# Patient Record
Sex: Female | Born: 1970 | Race: Black or African American | Hispanic: No | Marital: Single | Smoking: Never smoker
Health system: Southern US, Community
[De-identification: ages and names within clinical notes are randomized; demographics above are authoritative.]

## PROBLEM LIST (undated history)

## (undated) DIAGNOSIS — IMO0002 Reserved for concepts with insufficient information to code with codable children: Secondary | ICD-10-CM

## (undated) DIAGNOSIS — Z8669 Personal history of other diseases of the nervous system and sense organs: Secondary | ICD-10-CM

## (undated) DIAGNOSIS — R102 Pelvic and perineal pain: Secondary | ICD-10-CM

## (undated) DIAGNOSIS — N809 Endometriosis, unspecified: Secondary | ICD-10-CM

## (undated) DIAGNOSIS — K5900 Constipation, unspecified: Secondary | ICD-10-CM

## (undated) DIAGNOSIS — B373 Candidiasis of vulva and vagina: Secondary | ICD-10-CM

## (undated) DIAGNOSIS — B9689 Other specified bacterial agents as the cause of diseases classified elsewhere: Secondary | ICD-10-CM

## (undated) DIAGNOSIS — Z8639 Personal history of other endocrine, nutritional and metabolic disease: Secondary | ICD-10-CM

## (undated) DIAGNOSIS — D219 Benign neoplasm of connective and other soft tissue, unspecified: Secondary | ICD-10-CM

## (undated) DIAGNOSIS — Z8742 Personal history of other diseases of the female genital tract: Secondary | ICD-10-CM

## (undated) DIAGNOSIS — N760 Acute vaginitis: Secondary | ICD-10-CM

## (undated) HISTORY — DX: Personal history of other diseases of the nervous system and sense organs: Z86.69

## (undated) HISTORY — DX: Other specified bacterial agents as the cause of diseases classified elsewhere: B96.89

## (undated) HISTORY — DX: Endometriosis, unspecified: N80.9

## (undated) HISTORY — DX: Acute vaginitis: N76.0

## (undated) HISTORY — DX: Personal history of other diseases of the female genital tract: Z87.42

## (undated) HISTORY — DX: Reserved for concepts with insufficient information to code with codable children: IMO0002

## (undated) HISTORY — DX: Candidiasis of vulva and vagina: B37.3

## (undated) HISTORY — DX: Constipation, unspecified: K59.00

## (undated) HISTORY — DX: Benign neoplasm of connective and other soft tissue, unspecified: D21.9

## (undated) HISTORY — DX: Personal history of other endocrine, nutritional and metabolic disease: Z86.39

## (undated) HISTORY — DX: Pelvic and perineal pain: R10.2

---

## 1998-10-07 HISTORY — PX: REDUCTION MAMMAPLASTY: SUR839

## 2002-05-07 DIAGNOSIS — IMO0002 Reserved for concepts with insufficient information to code with codable children: Secondary | ICD-10-CM

## 2002-05-07 DIAGNOSIS — R87619 Unspecified abnormal cytological findings in specimens from cervix uteri: Secondary | ICD-10-CM

## 2002-05-07 HISTORY — DX: Unspecified abnormal cytological findings in specimens from cervix uteri: R87.619

## 2002-05-07 HISTORY — DX: Reserved for concepts with insufficient information to code with codable children: IMO0002

## 2003-11-02 ENCOUNTER — Other Ambulatory Visit: Admission: RE | Admit: 2003-11-02 | Discharge: 2003-11-02 | Payer: Self-pay | Admitting: Obstetrics and Gynecology

## 2004-06-07 DIAGNOSIS — B9689 Other specified bacterial agents as the cause of diseases classified elsewhere: Secondary | ICD-10-CM

## 2004-06-07 HISTORY — DX: Other specified bacterial agents as the cause of diseases classified elsewhere: B96.89

## 2004-06-26 DIAGNOSIS — R102 Pelvic and perineal pain: Secondary | ICD-10-CM

## 2004-06-26 HISTORY — DX: Pelvic and perineal pain: R10.2

## 2004-08-07 DIAGNOSIS — B3731 Acute candidiasis of vulva and vagina: Secondary | ICD-10-CM

## 2004-08-07 DIAGNOSIS — B373 Candidiasis of vulva and vagina: Secondary | ICD-10-CM

## 2004-08-07 HISTORY — DX: Candidiasis of vulva and vagina: B37.3

## 2004-08-07 HISTORY — DX: Acute candidiasis of vulva and vagina: B37.31

## 2004-09-25 ENCOUNTER — Ambulatory Visit: Payer: Self-pay | Admitting: Family Medicine

## 2004-10-07 DIAGNOSIS — K5900 Constipation, unspecified: Secondary | ICD-10-CM

## 2004-10-07 HISTORY — DX: Constipation, unspecified: K59.00

## 2004-11-02 ENCOUNTER — Other Ambulatory Visit: Admission: RE | Admit: 2004-11-02 | Discharge: 2004-11-02 | Payer: Self-pay | Admitting: Obstetrics and Gynecology

## 2006-06-17 ENCOUNTER — Other Ambulatory Visit: Admission: RE | Admit: 2006-06-17 | Discharge: 2006-06-17 | Payer: Self-pay | Admitting: Obstetrics and Gynecology

## 2009-10-07 DIAGNOSIS — Z8742 Personal history of other diseases of the female genital tract: Secondary | ICD-10-CM

## 2009-10-07 HISTORY — DX: Personal history of other diseases of the female genital tract: Z87.42

## 2009-12-11 DIAGNOSIS — D219 Benign neoplasm of connective and other soft tissue, unspecified: Secondary | ICD-10-CM

## 2009-12-11 HISTORY — DX: Benign neoplasm of connective and other soft tissue, unspecified: D21.9

## 2010-10-27 ENCOUNTER — Encounter: Payer: Self-pay | Admitting: Family Medicine

## 2010-10-28 ENCOUNTER — Encounter: Payer: Self-pay | Admitting: Obstetrics and Gynecology

## 2012-02-14 ENCOUNTER — Telehealth: Payer: Self-pay | Admitting: Obstetrics and Gynecology

## 2012-02-14 NOTE — Telephone Encounter (Signed)
Spoke with pt rgd msg pt c/o abdominal pelvic pain offered appt for eval on manday with AR pt declined appt will call back

## 2012-02-18 ENCOUNTER — Encounter: Payer: Self-pay | Admitting: Obstetrics and Gynecology

## 2012-03-09 ENCOUNTER — Encounter: Payer: Self-pay | Admitting: Obstetrics and Gynecology

## 2012-03-10 ENCOUNTER — Encounter: Payer: BC Managed Care – HMO | Admitting: Obstetrics and Gynecology

## 2012-03-10 ENCOUNTER — Telehealth: Payer: Self-pay | Admitting: Obstetrics and Gynecology

## 2012-03-10 ENCOUNTER — Encounter: Payer: Self-pay | Admitting: Obstetrics and Gynecology

## 2012-03-10 ENCOUNTER — Ambulatory Visit (INDEPENDENT_AMBULATORY_CARE_PROVIDER_SITE_OTHER): Payer: BC Managed Care – PPO | Admitting: Obstetrics and Gynecology

## 2012-03-10 VITALS — BP 118/78 | Wt 183.0 lb

## 2012-03-10 DIAGNOSIS — R102 Pelvic and perineal pain: Secondary | ICD-10-CM

## 2012-03-10 DIAGNOSIS — N946 Dysmenorrhea, unspecified: Secondary | ICD-10-CM | POA: Insufficient documentation

## 2012-03-10 DIAGNOSIS — N949 Unspecified condition associated with female genital organs and menstrual cycle: Secondary | ICD-10-CM

## 2012-03-10 DIAGNOSIS — D219 Benign neoplasm of connective and other soft tissue, unspecified: Secondary | ICD-10-CM

## 2012-03-10 DIAGNOSIS — D259 Leiomyoma of uterus, unspecified: Secondary | ICD-10-CM

## 2012-03-10 LAB — POCT URINALYSIS DIPSTICK
Clarity, UA: NEGATIVE
Protein, UA: NEGATIVE
Urobilinogen, UA: NEGATIVE

## 2012-03-10 LAB — POCT WET PREP (WET MOUNT)
Clue Cells Wet Prep Whiff POC: NEGATIVE
pH: 5.5

## 2012-03-10 MED ORDER — TRAMADOL HCL 50 MG PO TABS: 50.0000 mg | ORAL_TABLET | Freq: Four times a day (QID) | ORAL | Status: DC | PRN

## 2012-03-10 NOTE — Telephone Encounter (Signed)
TC to pt.   States her appt has been R/S'd x2. Desires eval today.   Sched w/ Dr AVS.

## 2012-03-10 NOTE — Telephone Encounter (Signed)
Triage/resched

## 2012-03-10 NOTE — Progress Notes (Signed)
Contraception: none History of STD:  no history of PID, STD's History of ovarian cyst: No History of fibroids: yes:   History of endometriosis:yes:   Previous ultrasound:yes:  2009  Urinary symptoms: none Gastro-intestinal symptoms:  Constipation: no     Diarrhea: no     Nausea: no     Vomiting: no     Fever: no Vaginal discharge: normal discharge   BT  Tammy Jenkins is a 41 y.o. year old female,No obstetric history on file., who presents for a problem visit. The patient has a known history of fibroids.  She has a past history of pelvic pain.  Subjective:  The patient reports that for the past 3 months she has had worsening dysmenorrhea.  She rates her pain as 10 out of 10.  She denies GU and GI symptoms.  She denies fever and chills.  Objective:  BP 118/78  Wt 183 lb (83.008 kg)  LMP 03/01/2012   General: alert, cooperative and no distress Resp: clear to auscultation bilaterally Cardio: regular rate and rhythm, S1, S2 normal, no murmur, click, rub or gallop GI: soft, non-tender; bowel sounds normal; no masses,  no organomegaly  External genitalia: normal general appearance Vaginal: normal mucosa without prolapse or lesions Cervix: normal appearance Adnexa: normal bimanual exam Uterus: upper limits normal size, irregular.  Wet prep: Negative  Assessment:  Fibroid uterus Dysmenorrhea  Plan:  Ultrasound of the pelvis GC and Chlamydia sent Ultram 50 mg every 6 hours when necessary pain  Return to office in 1 week(s).   Leonard Schwartz M.D.  03/10/2012 7:07 PM

## 2012-03-11 LAB — GC/CHLAMYDIA PROBE AMP, GENITAL: Chlamydia, DNA Probe: NEGATIVE

## 2012-04-22 ENCOUNTER — Encounter: Payer: BC Managed Care – PPO | Admitting: Obstetrics and Gynecology

## 2012-04-22 ENCOUNTER — Other Ambulatory Visit: Payer: BC Managed Care – PPO

## 2012-04-29 ENCOUNTER — Other Ambulatory Visit: Payer: BC Managed Care – PPO

## 2012-04-30 ENCOUNTER — Encounter: Payer: BC Managed Care – PPO | Admitting: Obstetrics and Gynecology

## 2012-04-30 ENCOUNTER — Other Ambulatory Visit: Payer: BC Managed Care – PPO

## 2012-05-11 ENCOUNTER — Ambulatory Visit (INDEPENDENT_AMBULATORY_CARE_PROVIDER_SITE_OTHER): Payer: BC Managed Care – PPO | Admitting: Obstetrics and Gynecology

## 2012-05-11 ENCOUNTER — Ambulatory Visit (INDEPENDENT_AMBULATORY_CARE_PROVIDER_SITE_OTHER): Payer: BC Managed Care – PPO

## 2012-05-11 ENCOUNTER — Other Ambulatory Visit: Payer: Self-pay | Admitting: Obstetrics and Gynecology

## 2012-05-11 ENCOUNTER — Encounter: Payer: Self-pay | Admitting: Obstetrics and Gynecology

## 2012-05-11 VITALS — BP 112/78 | Wt 188.0 lb

## 2012-05-11 DIAGNOSIS — D219 Benign neoplasm of connective and other soft tissue, unspecified: Secondary | ICD-10-CM

## 2012-05-11 DIAGNOSIS — N946 Dysmenorrhea, unspecified: Secondary | ICD-10-CM

## 2012-05-11 DIAGNOSIS — D259 Leiomyoma of uterus, unspecified: Secondary | ICD-10-CM

## 2012-05-11 DIAGNOSIS — N920 Excessive and frequent menstruation with regular cycle: Secondary | ICD-10-CM

## 2012-05-11 NOTE — Patient Instructions (Signed)
Myomectomy Myoma is a non-cancerous tumor made up of fibrous tissue. It is also called leiomyoma, but more often called a fibroid tumor. Myomectomy is the removal of a fibroid tumor without removing another organ, like the uterus or ovary, with it. Fibroids range from the size of a pea to a grapefruit. They are rarely cancerous. Myomas only need treatment when they are growing or when they cause symptoms, such aspain, pressure, bleeding, and pain with intercourse. LET YOUR CAREGIVER KNOW ABOUT:  Any allergies, especially to medicines.   If you develop a cold or an infection before your surgery.   Medicines taken, including vitamins, herbs, eyedrops, over-ther-counter medicines, and creams.   Use of steroids (by mouth or creams).   Previous problems with numbing medicines.   History of blood clots or other bleeding problems.   Other health problems, such as diabetes, kidney, heart, or lung problems.   Previous surgery.   Possibility of pregnancy, if this applies.  RISKS AND COMPLICATIONS   Excessive bleeding.   Infection.   Injury to other organs.   Blood clots in the legs, chest, and brain.   Scar tissue (adhesions) on other organs and in the pelvis.   Death during or after the surgery.  BEFORE THE PROCEDURE  Follow your caregiver's advice regarding your surgery and preparing for surgery.   Avoid taking aspirin or blood thinners as directed by your caregiver.   DO NOT eat or drink anything after midnight on the night before surgery, or as directed by your caregiver.   DO NOT smoke (if you smoke) for 2 weeks before the surgery.   DO NOT drink alcohol the day before the surgery.   If you are admitted the day of the surgery,arrive1 hour before your surgery is scheduled.   Arrange to have someone take you home from the hospital.   Arrange to have someone care for you when you go home.  PROCEDURE There are several ways to perform a myomectomy:  Hysteroscopy  myomectomy. A lighted tube is inserted inside the uterus. The tube will remove the fibroid. This is used when the fibroid is inside the cavity of the uterus.   Laparoscopic myomectomy. A long, lighted tube is inserted through 2 or 3 small incisions to see the organs in the pelvis. The fibroid is removed.   Myomectomy through a sugical cut (incicion) in the abdomen. The fibroid is removed through an incision made in the stomach. This way is performed when thethe fibroid cannot be removed with a hysteroscope or laprascope.  AFTER THE PROCEDURE  If you had laparoscopic or hysteroscopic myomectomy, you may go home the same day or stay overnight.   If you had abdominal myomectomy, you may stay in the hospital a few days.   Your intravenous (IV)access tube and catheter will be removed in 1 or 2 days.   If you stay in the hospital, your caregiver will order pain medicine and a sleeping pill, if needed.   You may be placed on an antibiotic medicine, if needed.   You may be given written instructions and medicines before you are sent home.  Document Released: 07/21/2007 Document Revised: 09/12/2011 Document Reviewed: 08/02/2009 [employer] Patient Information 2012 [employer]. 

## 2012-05-11 NOTE — Progress Notes (Signed)
Pt is here today for a follow up U/S for fibroids and dysmenorrhea.  HISTORY OF PRESENT ILLNESS  Ms. Tammy Jenkins is a 41 y.o. year old female,No obstetric history on file., who presents for a problem visit. The patient has a known history of fibroids, dysmenorrhea, and menorrhagia.  Ultrasound does help.  Subjective:  The patient wants to have children.  Objective:  BP 112/78  Wt 188 lb (85.276 kg)  LMP 04/29/2012   General: alert and no distress GI: soft, non-tender; bowel sounds normal; no masses,  no organomegaly  Exam deferred.  Ultrasound: Uterus 10.86 x 8.47 cm.  Endometrium 5.83 mm.  Ovaries within normal limits.  A minimum of 7 fibroids are noted with the largest fibroid measuring 5. To 8 cm.  Fibroids are thought to be subserosal and intramural.  Assessment:  Fibroids Menorrhagia Dysmenorrhea Wants to have children  Plan:  The management of fibroids was reviewed once again.  Risk and benefits were discussed.  The patient will review her options with her husband and then call us back to discuss what she would like to do.  Return to office prn if symptoms worsen or fail to improve.   Leonard Schwartz M.D.  05/11/2012 7:21 PM

## 2012-06-02 ENCOUNTER — Telehealth: Payer: Self-pay | Admitting: Obstetrics and Gynecology

## 2012-06-02 ENCOUNTER — Other Ambulatory Visit: Payer: Self-pay | Admitting: Obstetrics and Gynecology

## 2012-06-02 DIAGNOSIS — N946 Dysmenorrhea, unspecified: Secondary | ICD-10-CM

## 2012-06-02 MED ORDER — TRAMADOL HCL 50 MG PO TABS: 50.0000 mg | ORAL_TABLET | Freq: Four times a day (QID) | ORAL | Status: DC | PRN

## 2012-06-02 NOTE — Telephone Encounter (Signed)
TC to pt. States was informed that has multiple fibroids.  Having more left-sided pain.   Questioning if could have procedure to alleviate that particular are and not all of fibroids. Also requests RF for Ultram. States takes 2/day. DR AVS to be made aware.

## 2012-06-02 NOTE — Telephone Encounter (Signed)
TC to pt. LM to return call.  

## 2012-06-03 ENCOUNTER — Telehealth: Payer: Self-pay | Admitting: Obstetrics and Gynecology

## 2012-06-03 NOTE — Telephone Encounter (Signed)
TC to pt. Informed per DR AVS that it is possible to have U/S ablation on one fibroid, but there is no guarantee that it will relieve all pain. Pt verbalizes comprehension.Pt states there is a study about fibroids being done at [facility], and she has contacted them for information. Advised needs appt if additional pain med needed.

## 2012-07-17 ENCOUNTER — Telehealth: Payer: Self-pay | Admitting: Obstetrics and Gynecology

## 2012-07-17 NOTE — Telephone Encounter (Signed)
Tc to pt regarding msg, lm on vm to call back. 

## 2012-07-21 ENCOUNTER — Telehealth: Payer: Self-pay | Admitting: Obstetrics and Gynecology

## 2012-07-21 NOTE — Telephone Encounter (Signed)
Returned pt's call. LM to return call.   

## 2012-07-22 NOTE — Telephone Encounter (Signed)
TC from pt.  States is having pain daily and is taking Aleve. Has appt at [facility] to discuss options 08/17/12.  Questioning if there is any surgery that could be done now to provide relief of pain due to fibroids, without having to do ablation or hysterectomy. Pt questioned if could have laporoscopy. Explained may not treat fibroids. Dr AVS to be made aware.

## 2012-07-23 ENCOUNTER — Telehealth: Payer: Self-pay | Admitting: Obstetrics and Gynecology

## 2012-07-23 NOTE — Telephone Encounter (Signed)
Returned pt's call.  Is questioning if any response regarding 07/22/12 phone call. LM for pt awaiting response and DR AVS has been notified.

## 2012-07-27 ENCOUNTER — Encounter: Payer: BC Managed Care – PPO | Admitting: Obstetrics and Gynecology

## 2012-07-29 ENCOUNTER — Telehealth: Payer: Self-pay | Admitting: Obstetrics and Gynecology

## 2012-07-29 NOTE — Telephone Encounter (Signed)
Pt was called back regarding her message/questions.  Pt wants to discuss what options to take to have fibroids removed. Pt last saw AVS on 05-11-2012 Pt does not want a procedure that would eliminate chances of having children.  Pt states the pain is getting worse and she is having to take pain medication everyday. I made pt aware I will consult w/ AVS on 07-30-2012 and she can expect a call back by the end of the afternoon on 07-30-12.  Pt agreeable.

## 2012-07-29 NOTE — Telephone Encounter (Signed)
Returned pt's call. States has already spoken with Latoya who will F/U with DR AVS.

## 2012-08-03 ENCOUNTER — Telehealth: Payer: Self-pay

## 2012-08-03 NOTE — Telephone Encounter (Signed)
Per AVS pt needs too come in for appointment to discuss Fibroids. Pt was called to schedule appt Pt stated her AEX is due as well. Pt scheduled to see AVS 08/10/2012 @ 10:30am for AEX and to discuss fibroids. Pt agreeable.  [facility] CMA

## 2012-08-10 ENCOUNTER — Encounter: Payer: Self-pay | Admitting: Obstetrics and Gynecology

## 2012-08-10 ENCOUNTER — Ambulatory Visit (INDEPENDENT_AMBULATORY_CARE_PROVIDER_SITE_OTHER): Payer: BC Managed Care – PPO | Admitting: Obstetrics and Gynecology

## 2012-08-10 ENCOUNTER — Telehealth: Payer: Self-pay | Admitting: Obstetrics and Gynecology

## 2012-08-10 VITALS — BP 120/80 | HR 72 | Resp 16 | Ht 60.0 in | Wt 192.0 lb

## 2012-08-10 DIAGNOSIS — Z01419 Encounter for gynecological examination (general) (routine) without abnormal findings: Secondary | ICD-10-CM

## 2012-08-10 NOTE — Telephone Encounter (Signed)
Pt calling with FYI and update

## 2012-08-10 NOTE — Progress Notes (Signed)
Subjective:   Date of visit: August 10, 2012   Tammy Jenkins is a 41 y.o. female, G0P0, who presents for an annual exam. The patient has a known history of fibroids.  She complains of menorrhagia and dysmenorrhea.  She has an appointment on August 27, 2012 at [facility] to discuss uterine artery embolization.  She requested that I do something about her fibroids prior to that time.    History   Social History  . Marital Status: Single    Spouse Name: N/A    Number of Children: N/A  . Years of Education: N/A   Social History Main Topics  . Smoking status: Never Smoker   . Smokeless tobacco: Never Used  . Alcohol Use: No  . Drug Use: No  . Sexually Active: Yes    Birth Control/ Protection: None   Other Topics Concern  . None   Social History Narrative  . None    Menstrual cycle:   LMP: Patient's last menstrual period was 08/08/2012.           Cycle: heavy and painful.  The following portions of the patient's history were reviewed and updated as appropriate: allergies, current medications, past family history, past medical history, past social history, past surgical history and problem list.  Review of Systems Pertinent items are noted in HPI. Breast:Negative for breast lump,nipple discharge or nipple retraction Gastrointestinal: Negative for abdominal pain, change in bowel habits or rectal bleeding Urinary:negative   Objective:    BP 120/80  Pulse 72  Resp 16  Ht 5' (1.524 m)  Wt 192 lb (87.091 kg)  BMI 37.50 kg/m2  LMP 08/08/2012    Weight:  Wt Readings from Last 1 Encounters:  08/10/12 192 lb (87.091 kg)          BMI: Body mass index is 37.50 kg/(m^2).  General Appearance: Alert, appropriate appearance for age. No acute distress HEENT: Grossly normal Neck / Thyroid: Supple, no masses, nodes or enlargement Lungs: clear to auscultation bilaterally Back: No CVA tenderness Breast Exam: No masses or nodes.No dimpling, nipple  retraction or discharge. Cardiovascular: Regular rate and rhythm. S1, S2, no murmur Gastrointestinal: Soft, non-tender, no masses or organomegaly  ++++++++++++++++++++++++++++++++++++++++++++++++++++++++  Pelvic Exam: External genitalia: normal general appearance Vaginal: normal without tenderness, induration or masses Cervix: normal appearance Adnexa: normal bimanual exam Uterus: 10 week size, irregular, firm Rectovaginal: normal rectal, no masses  ++++++++++++++++++++++++++++++++++++++++++++++++++++++++  Lymphatic Exam: Non-palpable nodes in neck, clavicular, axillary, or inguinal regions Neurologic: Normal speech, no tremor  Psychiatric: Alert and oriented, appropriate affect.   Assessment:    fibroids   dysmenorrhea  Overweight or obese: Yes   Pelvic relaxation: No   Plan:    Management of fibroids was again reviewed.  Medication and surgical options outlined.  Risk and benefits discussed.  The patient was told that I can try and see if we can get an earlier appointment but that her options for management are essentially the same now as they were for the past several years.  mammogram pap smear return annually or prn Contraception:abstinence    STD screen request: No   The updated Pap smear screening guidelines were discussed with the patient. The patient requested that I obtain a Pap smear: Yes.  Kegel exercises discussed: No.  Proper diet and regular exercise were reviewed.  Annual mammograms recommended starting at age 42. Proper breast care was discussed.  Screening colonoscopy is recommended beginning at age 37.  Regular health maintenance was  reviewed.  Sleep hygiene was discussed.  Adequate calcium and vitamin D intake was emphasized.  Leonard Schwartz M.D.    Regular Periods: yes Mammogram: no  Monthly Breast Ex.: yes Exercise: no  Tetanus < 10 years: no Seatbelts: yes  NI. Bladder Functn.: yes Abuse at home: no  Daily BM's: yes  Stressful Work: no  Healthy Diet: yes Sigmoid-Colonoscopy: None  Calcium: no Medical problems this year: None   LAST PAP:12/11/2009 WNL  Contraception: None  Mammogram:  None  PCP: Hyacinth Meeker  PMH: None  FMH: None  Last Bone Scan: None

## 2012-08-12 ENCOUNTER — Telehealth: Payer: Self-pay | Admitting: Obstetrics and Gynecology

## 2012-08-14 ENCOUNTER — Other Ambulatory Visit: Payer: Self-pay | Admitting: Obstetrics and Gynecology

## 2012-08-14 ENCOUNTER — Other Ambulatory Visit: Payer: Self-pay

## 2012-08-14 DIAGNOSIS — N946 Dysmenorrhea, unspecified: Secondary | ICD-10-CM

## 2012-08-14 DIAGNOSIS — D219 Benign neoplasm of connective and other soft tissue, unspecified: Secondary | ICD-10-CM

## 2012-08-14 DIAGNOSIS — N920 Excessive and frequent menstruation with regular cycle: Secondary | ICD-10-CM

## 2012-08-18 ENCOUNTER — Ambulatory Visit
Admission: RE | Admit: 2012-08-18 | Discharge: 2012-08-18 | Disposition: A | Discharge status: Home / Self Care | Payer: BC Managed Care – PPO | Source: Ambulatory Visit | Attending: Obstetrics and Gynecology | Admitting: Obstetrics and Gynecology

## 2012-08-18 DIAGNOSIS — D219 Benign neoplasm of connective and other soft tissue, unspecified: Secondary | ICD-10-CM

## 2012-08-21 ENCOUNTER — Ambulatory Visit
Admission: RE | Admit: 2012-08-21 | Discharge: 2012-08-21 | Disposition: A | Discharge status: Home / Self Care | Payer: BC Managed Care – PPO | Source: Ambulatory Visit | Attending: Obstetrics and Gynecology | Admitting: Obstetrics and Gynecology

## 2012-08-21 DIAGNOSIS — D219 Benign neoplasm of connective and other soft tissue, unspecified: Secondary | ICD-10-CM

## 2012-08-27 ENCOUNTER — Other Ambulatory Visit: Payer: BC Managed Care – PPO

## 2012-08-28 ENCOUNTER — Ambulatory Visit
Admission: RE | Admit: 2012-08-28 | Discharge: 2012-08-28 | Disposition: A | Discharge status: Home / Self Care | Payer: BC Managed Care – PPO | Source: Ambulatory Visit | Attending: Obstetrics and Gynecology | Admitting: Obstetrics and Gynecology

## 2012-08-28 MED ORDER — GADOBENATE DIMEGLUMINE 529 MG/ML IV SOLN
16.0000 mL | Freq: Once | INTRAVENOUS | Status: AC | PRN
  Administered 2012-08-28: 16 mL via INTRAVENOUS

## 2012-09-02 ENCOUNTER — Telehealth: Payer: Self-pay | Admitting: Emergency Medicine

## 2012-09-02 NOTE — Telephone Encounter (Signed)
S/W PT. DR Miles Costain SAID MRI GOOD TO GO FOR UFE.  WILL SUBMIT FOR INS. AUTHO AND HAVE TINA AT [facility] CONTACT PT TO SET UP PROCEDURE.

## 2012-09-08 ENCOUNTER — Telehealth: Payer: Self-pay | Admitting: Emergency Medicine

## 2012-09-08 NOTE — Telephone Encounter (Signed)
LM TO MAKE PT. AWARE THAT INS. REQUIRED NO AUTHO FOR [place] PROCEDURE AND SHOULD HEAR FROM TINA SOON.

## 2012-09-10 ENCOUNTER — Other Ambulatory Visit: Payer: Self-pay | Admitting: Interventional Radiology

## 2012-09-10 DIAGNOSIS — D219 Benign neoplasm of connective and other soft tissue, unspecified: Secondary | ICD-10-CM

## 2012-09-15 ENCOUNTER — Encounter: Payer: Self-pay | Admitting: Emergency Medicine

## 2012-09-21 ENCOUNTER — Encounter: Payer: Self-pay | Admitting: Emergency Medicine

## 2012-09-21 ENCOUNTER — Telehealth: Payer: Self-pay | Admitting: Obstetrics and Gynecology

## 2012-09-21 DIAGNOSIS — N946 Dysmenorrhea, unspecified: Secondary | ICD-10-CM

## 2012-09-21 MED ORDER — TRAMADOL HCL 50 MG PO TABS: 50.0000 mg | ORAL_TABLET | Freq: Four times a day (QID) | ORAL | Status: AC | PRN

## 2012-09-21 NOTE — Telephone Encounter (Signed)
Tc to pt per AVS recs. Pt may have rf for Ultram. Ultram e-pres to pharm on file. Pt agrees.

## 2012-09-21 NOTE — Telephone Encounter (Signed)
Tc to pt per telephone call. Pt request rf for Ultram. Surgery sched 10/08/12 for UFE. Will consutl with AVS per req. Pt agrees.

## 2012-09-25 ENCOUNTER — Encounter (HOSPITAL_COMMUNITY): Payer: Self-pay | Admitting: Pharmacy Technician

## 2012-10-05 ENCOUNTER — Other Ambulatory Visit: Payer: Self-pay | Admitting: Radiology

## 2012-10-08 ENCOUNTER — Observation Stay (HOSPITAL_COMMUNITY)
Admission: RE | Admit: 2012-10-08 | Discharge: 2012-10-09 | Disposition: A | Discharge status: Home / Self Care | Payer: BC Managed Care – PPO | Source: Ambulatory Visit | Attending: Interventional Radiology | Admitting: Interventional Radiology

## 2012-10-08 ENCOUNTER — Other Ambulatory Visit: Payer: Self-pay | Admitting: Interventional Radiology

## 2012-10-08 ENCOUNTER — Encounter (HOSPITAL_COMMUNITY): Payer: Self-pay

## 2012-10-08 VITALS — BP 143/89 | HR 84 | Temp 97.8°F | Resp 18 | Ht 60.0 in | Wt 180.0 lb

## 2012-10-08 DIAGNOSIS — D219 Benign neoplasm of connective and other soft tissue, unspecified: Secondary | ICD-10-CM

## 2012-10-08 DIAGNOSIS — D259 Leiomyoma of uterus, unspecified: Principal | ICD-10-CM | POA: Insufficient documentation

## 2012-10-08 LAB — CBC WITH DIFFERENTIAL/PLATELET
Basophils Absolute: 0 K/uL (ref 0.0–0.1)
Basophils Relative: 1 % (ref 0–1)
Eosinophils Absolute: 0.2 K/uL (ref 0.0–0.7)
Eosinophils Relative: 5 % (ref 0–5)
HCT: 37.5 % (ref 36.0–46.0)
Hemoglobin: 12.2 g/dL (ref 12.0–15.0)
Lymphocytes Relative: 39 % (ref 12–46)
Lymphs Abs: 1.7 K/uL (ref 0.7–4.0)
MCH: 26 pg (ref 26.0–34.0)
MCHC: 32.5 g/dL (ref 30.0–36.0)
MCV: 79.8 fL (ref 78.0–100.0)
Monocytes Absolute: 0.2 K/uL (ref 0.1–1.0)
Monocytes Relative: 6 % (ref 3–12)
Neutro Abs: 2.1 K/uL (ref 1.7–7.7)
Neutrophils Relative %: 50 % (ref 43–77)
Platelets: 246 K/uL (ref 150–400)
RBC: 4.7 MIL/uL (ref 3.87–5.11)
RDW: 14.6 % (ref 11.5–15.5)
WBC: 4.2 K/uL (ref 4.0–10.5)

## 2012-10-08 LAB — BASIC METABOLIC PANEL WITH GFR
BUN: 10 mg/dL (ref 6–23)
CO2: 23 meq/L (ref 19–32)
Calcium: 8.5 mg/dL (ref 8.4–10.5)
Chloride: 103 meq/L (ref 96–112)
Creatinine, Ser: 0.83 mg/dL (ref 0.50–1.10)
GFR calc Af Amer: >90 mL/min
GFR calc non Af Amer: 85 mL/min — ABNORMAL LOW
Glucose, Bld: 111 mg/dL — ABNORMAL HIGH (ref 70–99)
Potassium: 3.5 meq/L (ref 3.5–5.1)
Sodium: 136 meq/L (ref 135–145)

## 2012-10-08 LAB — PROTIME-INR
INR: 0.89 (ref 0.00–1.49)
Prothrombin Time: 12 s (ref 11.6–15.2)

## 2012-10-08 LAB — HCG, SERUM, QUALITATIVE: Preg, Serum: NEGATIVE

## 2012-10-08 LAB — APTT: aPTT: 30 s (ref 24–37)

## 2012-10-08 LAB — GLUCOSE, CAPILLARY: Glucose-Capillary: 133 mg/dL — ABNORMAL HIGH (ref 70–99)

## 2012-10-08 MED ORDER — DOCUSATE SODIUM 100 MG PO CAPS
100.0000 mg | ORAL_CAPSULE | Freq: Two times a day (BID) | ORAL | Status: DC
Start: 2012-10-08 — End: 2012-10-09
  Administered 2012-10-08 – 2012-10-09 (×2): 100 mg via ORAL
  Filled 2012-10-08 (×4): qty 1

## 2012-10-08 MED ORDER — HYDROMORPHONE HCL PF 2 MG/ML IJ SOLN
INTRAMUSCULAR | Status: AC
  Filled 2012-10-08: qty 1

## 2012-10-08 MED ORDER — SODIUM CHLORIDE 0.9 % IV SOLN: 250.0000 mL | INTRAVENOUS | Status: DC | PRN

## 2012-10-08 MED ORDER — DIPHENHYDRAMINE HCL 50 MG/ML IJ SOLN: 12.5000 mg | Freq: Four times a day (QID) | INTRAMUSCULAR | Status: DC | PRN

## 2012-10-08 MED ORDER — FENTANYL CITRATE 0.05 MG/ML IJ SOLN
INTRAMUSCULAR | Status: AC
  Filled 2012-10-08: qty 8

## 2012-10-08 MED ORDER — HYDROMORPHONE 0.3 MG/ML IV SOLN
INTRAVENOUS | Status: DC
  Administered 2012-10-08: 11:00:00 via INTRAVENOUS
  Filled 2012-10-08: qty 25

## 2012-10-08 MED ORDER — HYDROMORPHONE HCL PF 1 MG/ML IJ SOLN
INTRAMUSCULAR | Status: AC | PRN
  Administered 2012-10-08: 2 mg via INTRAVENOUS

## 2012-10-08 MED ORDER — ONDANSETRON HCL 4 MG/2ML IJ SOLN
4.0000 mg | Freq: Four times a day (QID) | INTRAMUSCULAR | Status: DC | PRN
  Administered 2012-10-08 – 2012-10-09 (×2): 4 mg via INTRAVENOUS
  Filled 2012-10-08 (×2): qty 2

## 2012-10-08 MED ORDER — SODIUM CHLORIDE 0.9 % IJ SOLN: 9.0000 mL | INTRAMUSCULAR | Status: DC | PRN

## 2012-10-08 MED ORDER — HYDROMORPHONE 0.3 MG/ML IV SOLN: INTRAVENOUS | Status: DC

## 2012-10-08 MED ORDER — LABETALOL HCL 5 MG/ML IV SOLN
20.0000 mg | Freq: Once | INTRAVENOUS | Status: AC
  Administered 2012-10-08: 20 mg via INTRAVENOUS
  Filled 2012-10-08: qty 4

## 2012-10-08 MED ORDER — ONDANSETRON HCL 4 MG/2ML IJ SOLN: 4.0000 mg | Freq: Four times a day (QID) | INTRAMUSCULAR | Status: DC | PRN

## 2012-10-08 MED ORDER — NALOXONE HCL 0.4 MG/ML IJ SOLN: 0.4000 mg | INTRAMUSCULAR | Status: DC | PRN

## 2012-10-08 MED ORDER — LABETALOL HCL 5 MG/ML IV SOLN
20.0000 mg | INTRAVENOUS | Status: DC | PRN
  Administered 2012-10-08 (×2): 20 mg via INTRAVENOUS
  Filled 2012-10-08 (×2): qty 4

## 2012-10-08 MED ORDER — CEFAZOLIN SODIUM-DEXTROSE 2-3 GM-% IV SOLR
2.0000 g | Freq: Once | INTRAVENOUS | Status: DC
  Filled 2012-10-08: qty 50

## 2012-10-08 MED ORDER — MIDAZOLAM HCL 2 MG/2ML IJ SOLN
INTRAMUSCULAR | Status: AC | PRN
  Administered 2012-10-08 (×2): 2 mg via INTRAVENOUS

## 2012-10-08 MED ORDER — SODIUM CHLORIDE 0.9 % IJ SOLN: 3.0000 mL | INTRAMUSCULAR | Status: DC | PRN

## 2012-10-08 MED ORDER — DIPHENHYDRAMINE HCL 12.5 MG/5ML PO ELIX: 12.5000 mg | ORAL_SOLUTION | Freq: Four times a day (QID) | ORAL | Status: DC | PRN

## 2012-10-08 MED ORDER — SODIUM CHLORIDE 0.9 % IJ SOLN: 3.0000 mL | Freq: Two times a day (BID) | INTRAMUSCULAR | Status: DC

## 2012-10-08 MED ORDER — MORPHINE SULFATE (PF) 1 MG/ML IV SOLN
INTRAVENOUS | Status: DC
  Administered 2012-10-08: 23:00:00 via INTRAVENOUS
  Administered 2012-10-09: 9 mg via INTRAVENOUS
  Administered 2012-10-09: 18 mg via INTRAVENOUS
  Administered 2012-10-09: 7.5 mg via INTRAVENOUS
  Filled 2012-10-08 (×2): qty 25

## 2012-10-08 MED ORDER — PROMETHAZINE HCL 25 MG PO TABS
25.0000 mg | ORAL_TABLET | Freq: Three times a day (TID) | ORAL | Status: DC | PRN
  Administered 2012-10-08 – 2012-10-09 (×2): 25 mg via ORAL
  Filled 2012-10-08 (×2): qty 1

## 2012-10-08 MED ORDER — MIDAZOLAM HCL 2 MG/2ML IJ SOLN
INTRAMUSCULAR | Status: AC
  Filled 2012-10-08: qty 8

## 2012-10-08 MED ORDER — LABETALOL HCL 5 MG/ML IV SOLN
0.5000 mg/min | INTRAVENOUS | Status: DC
  Filled 2012-10-08: qty 100

## 2012-10-08 MED ORDER — NITROGLYCERIN 1 MG/10 ML FOR IR/CATH LAB
300.0000 ug | INTRA_ARTERIAL | Status: AC
  Administered 2012-10-08: 300 ug via INTRA_ARTERIAL

## 2012-10-08 MED ORDER — LIDOCAINE HCL 1 % IJ SOLN
INTRAMUSCULAR | Status: AC
  Filled 2012-10-08: qty 20

## 2012-10-08 MED ORDER — SODIUM CHLORIDE 0.9 % IV SOLN: INTRAVENOUS | Status: DC

## 2012-10-08 MED ORDER — DIPHENHYDRAMINE HCL 12.5 MG/5ML PO ELIX
12.5000 mg | ORAL_SOLUTION | Freq: Four times a day (QID) | ORAL | Status: DC | PRN
  Filled 2012-10-08: qty 5

## 2012-10-08 MED ORDER — KETOROLAC TROMETHAMINE 30 MG/ML IJ SOLN
30.0000 mg | Freq: Once | INTRAMUSCULAR | Status: AC
  Administered 2012-10-08: 30 mg via INTRAVENOUS
  Filled 2012-10-08: qty 1

## 2012-10-08 MED ORDER — KETOROLAC TROMETHAMINE 30 MG/ML IJ SOLN
30.0000 mg | Freq: Four times a day (QID) | INTRAMUSCULAR | Status: DC
  Administered 2012-10-08 – 2012-10-09 (×5): 30 mg via INTRAVENOUS
  Filled 2012-10-08 (×9): qty 1

## 2012-10-08 MED ORDER — PROMETHAZINE HCL 25 MG RE SUPP: 25.0000 mg | Freq: Three times a day (TID) | RECTAL | Status: DC | PRN

## 2012-10-08 MED ORDER — FENTANYL CITRATE 0.05 MG/ML IJ SOLN
INTRAMUSCULAR | Status: AC | PRN
  Administered 2012-10-08 (×2): 100 ug via INTRAVENOUS

## 2012-10-08 MED ORDER — MORPHINE SULFATE (PF) 1 MG/ML IV SOLN
INTRAVENOUS | Status: DC
  Administered 2012-10-08: 6 mg via INTRAVENOUS
  Administered 2012-10-08: 17:00:00 via INTRAVENOUS
  Filled 2012-10-08 (×2): qty 25

## 2012-10-08 NOTE — Progress Notes (Signed)
Dr Miles Costain in IR notified of elevated BP. To continue to monitor.Hartley Barefoot

## 2012-10-08 NOTE — Progress Notes (Signed)
Subjective: Pt now about 6 hrs post [place] procedure. Has had continued uncontrolled pain, mostly on left side, even with Dilaudid PCA. Has also had steady elevation of her BP to a peak of 189/125. Has received 20mg  IV labetalol push X 1 with no change. She denies CP, SOB, headache, dizziness, palpitations  Objective: Physical Exam: BP 189/125  Pulse 88  Temp 97.4 F (36.3 C) (Oral)  Resp 8  Ht 5' (1.524 m)  Wt 180 lb (81.647 kg)  BMI 35.15 kg/m2  SpO2 96%  LMP 09/30/2012 Heart: Reg Lungs: CTA Abd: soft, ND, mildly tender Left lower abd/suprapubic and groin (B)groin dressings clean and dry, no hematoma Feet warm, excellent pedal pulses.   Labs: CBC  Basename 10/08/12 0838  WBC 4.2  HGB 12.2  HCT 37.5  PLT 246   BMET  Basename 10/08/12 0838  NA 136  K 3.5  CL 103  CO2 23  GLUCOSE 111*  BUN 10  CREATININE 0.83  CALCIUM 8.5   LFT No results found for this basename: PROT,ALBUMIN,AST,ALT,ALKPHOS,BILITOT,BILIDIR,IBILI,LIPASE in the last 72 hours PT/INR  Basename 10/08/12 0838  LABPROT 12.0  INR 0.89     Studies/Results: No results found.  Assessment/Plan: S/p (B)[place] Post op acute hypertension Will change PCA to Morphine and add heating pad at pt request. Foley out. Will re-dose Labetalol and set parameters for BP.    LOS: 0 days    Brayton El PA-C 10/08/2012 4:20 PM

## 2012-10-08 NOTE — Procedures (Signed)
Successful BILATERAL [place](UFE) NO COMP STABLE FULL REPORT IN PACS OVERNIGHT RECOVERY

## 2012-10-08 NOTE — H&P (Signed)
Chief Complaint: "I'm here for my fibroid procedure" Referring Physician:Stringer HPI: Tammy Jenkins is an 42 y.o. female who was referred to Dr. Miles Costain for symptomatic fibroids. See IR Eval in PACS for details. After their discussion, she is scheduled for UFE procedure today. She is feeling well, no recent illnesses or complaints. PMHx and meds reviewed.  Past Medical History:  Past Medical History  Diagnosis Date  . H/O thyroid disease   . Hx of migraines   . Abnormal Pap smear 05/2002  . BV (bacterial vaginosis) 06/2004  . Pelvic pain 06/26/2004  . Monilial vaginitis 08/2004  . Constipation 10/2004  . Endometriosis   . H/O: menorrhagia 2011  . Fibroid 12/11/2009    Past Surgical History: No past surgical history on file.  Family History:  Family History  Problem Relation Age of Onset  . Diabetes Maternal Grandmother   . Diabetes Father   . Hypertension Father   . Hypertension Mother     Social History:  reports that she has never smoked. She has never used smokeless tobacco. She reports that she does not drink alcohol or use illicit drugs.  Allergies: No Known Allergies  Medications: acetaminophen (TYLENOL) 325 MG tablet (Taking) Sig - Route: Take 650 mg by mouth every 6 (six) hours as needed. Pain - Oral Class: Historical Med Number of times this order has been changed since signing: 1 Order [address] naproxen sodium (ANAPROX) 220 MG tablet (Taking) Sig - Route: Take 220 mg by mouth 2 (two) times daily with a meal. - Oral Class: Historical Med Number of times this order has been changed since signing: 1 Order [address] traMADol (ULTRAM) 50 MG tablet (Taking) 30 tablet 2 09/21/2012 09/21/2013 Sig - Route: Take 1 tablet (50 mg total) by mouth every 6 (six) hours as needed for pain. - Oral Number of times this order has been    Please HPI for pertinent positives, otherwise complete 10 system ROS negative.  Physical Exam: Blood pressure 143/93, pulse 90, temperature 98 F (36.7  C), temperature source Oral, resp. rate 21, last menstrual period 09/30/2012, SpO2 95.00%. There is no height or weight on file to calculate BMI.   General Appearance:  Alert, cooperative, no distress, appears stated age  Head:  Normocephalic, without obvious abnormality, atraumatic  ENT: Unremarkable  Neck: Supple, symmetrical, trachea midline, no adenopathy, thyroid: not enlarged, symmetric, no tenderness/mass/nodules  Lungs:   Clear to auscultation bilaterally, no w/r/r, respirations unlabored without use of accessory muscles.  Heart:  Regular rate and rhythm, S1, S2 normal, no murmur, rub or gallop. Carotids 2+ without bruit.  Abdomen:   Soft, non-tender, non distended. Bowel sounds active all four quadrants,  no masses, no organomegaly.  Extremities: Extremities normal, atraumatic, no cyanosis or edema  Pulses: 2+ and symmetric distal pedal pulses  Neurologic: Normal affect, no gross deficits.   Results for orders placed during the hospital encounter of 10/08/12 (from the past 48 hour(s))  APTT     Status: Normal   Collection Time   10/08/12  8:38 AM      Component Value Range Comment   aPTT 30  24 - 37 seconds   CBC WITH DIFFERENTIAL     Status: Normal   Collection Time   10/08/12  8:38 AM      Component Value Range Comment   WBC 4.2  4.0 - 10.5 K/uL    RBC 4.70  3.87 - 5.11 MIL/uL    Hemoglobin 12.2  12.0 - 15.0 g/dL  HCT 37.5  36.0 - 46.0 %    MCV 79.8  78.0 - 100.0 fL    MCH 26.0  26.0 - 34.0 pg    MCHC 32.5  30.0 - 36.0 g/dL    RDW 21.3  08.6 - 57.8 %    Platelets 246  150 - 400 K/uL    Neutrophils Relative 50  43 - 77 %    Neutro Abs 2.1  1.7 - 7.7 K/uL    Lymphocytes Relative 39  12 - 46 %    Lymphs Abs 1.7  0.7 - 4.0 K/uL    Monocytes Relative 6  3 - 12 %    Monocytes Absolute 0.2  0.1 - 1.0 K/uL    Eosinophils Relative 5  0 - 5 %    Eosinophils Absolute 0.2  0.0 - 0.7 K/uL    Basophils Relative 1  0 - 1 %    Basophils Absolute 0.0  0.0 - 0.1 K/uL   HCG, SERUM,  QUALITATIVE     Status: Normal   Collection Time   10/08/12  8:38 AM      Component Value Range Comment   Preg, Serum NEGATIVE  NEGATIVE   PROTIME-INR     Status: Normal   Collection Time   10/08/12  8:38 AM      Component Value Range Comment   Prothrombin Time 12.0  11.6 - 15.2 seconds    INR 0.89  0.00 - 1.49    No results found.  Assessment/Plan Symptomatic uterine fibroids For bilateral Uterine artery embolization procedure today. Reviewed procedure, including risks, complications. Labs reviewed Consent signed in chart  Brayton El PA-C 10/08/2012, 9:25 AM

## 2012-10-09 ENCOUNTER — Other Ambulatory Visit: Payer: Self-pay | Admitting: Radiology

## 2012-10-09 DIAGNOSIS — D259 Leiomyoma of uterus, unspecified: Secondary | ICD-10-CM

## 2012-10-09 LAB — GLUCOSE, CAPILLARY: Glucose-Capillary: 102 mg/dL — ABNORMAL HIGH (ref 70–99)

## 2012-10-09 MED ORDER — HYDROMORPHONE HCL PF 2 MG/ML IJ SOLN: 2.0000 mg | Freq: Once | INTRAMUSCULAR | Status: DC

## 2012-10-09 MED ORDER — LORAZEPAM 2 MG/ML IJ SOLN
INTRAMUSCULAR | Status: AC
  Filled 2012-10-09: qty 1

## 2012-10-09 MED ORDER — CLONIDINE HCL 0.1 MG PO TABS
0.1000 mg | ORAL_TABLET | Freq: Once | ORAL | Status: AC
  Administered 2012-10-09: 0.1 mg via ORAL
  Filled 2012-10-09 (×2): qty 1

## 2012-10-09 NOTE — Progress Notes (Signed)
The pt has had an elevated BP the majority of this shift, Dr. Bonnielee Haff aware.  Per pt when she has painful menses at home she will become diaphoretic and have to lay on the cold floor to feel better.  Pt did verbalize that during these times she has checked her BP and found it to be elevated.  Pt has not rated her pain under a 7 this shift.  Labetalol PRN given x 1 w/ no improvement, encouraged pt to use her PCA more frequently, clonidine 0.1 mg given w/ a decrease in BP.

## 2012-10-09 NOTE — Progress Notes (Signed)
Subjective: Pt with intermittent pelvic cramping, nausea this am; has voided without difficulty and eaten small amt of food without vomiting. BP sl improved   Objective: Vital signs in last 24 hours: Temp:  [97.4 F (36.3 C)-98.4 F (36.9 C)] 98.4 F (36.9 C) (01/03 0555) Pulse Rate:  [81-105] 87  (01/03 0555) Resp:  [7-20] 11  (01/03 0741) BP: (131-197)/(80-139) 140/92 mmHg (01/03 0555) SpO2:  [92 %-100 %] 92 % (01/03 0741) Weight:  [180 lb (81.647 kg)] 180 lb (81.647 kg) (01/02 1224) Last BM Date: 10/08/12  Intake/Output from previous day: 01/02 0701 - 01/03 0700 In: 795 [P.O.:715; I.V.:80] Out: 400 [Urine:400] Intake/Output this shift: Total I/O In: 240 [P.O.:240] Out: 400 [Urine:400]  Pt awake/alert; chest- CTA bilat; heart-RRR; abd- soft, fibroid uterus, tender primarily in LLQ,+BS; both right/left CFA puncture sites clean and dry, NT, soft, no hematomas; intact distal pulses.  Lab Results:   [facility] 10/08/12 0838  WBC 4.2  HGB 12.2  HCT 37.5  PLT 246   BMET  Basename 10/08/12 0838  NA 136  K 3.5  CL 103  CO2 23  GLUCOSE 111*  BUN 10  CREATININE 0.83  CALCIUM 8.5   PT/INR  Basename 10/08/12 0838  LABPROT 12.0  INR 0.89   ABG No results found for this basename: PHART:2,PCO2:2,PO2:2,HCO3:2 in the last 72 hours  Studies/Results: Ir Angiogram Pelvis Selective Or Supraselective  10/08/2012  *RADIOLOGY REPORT*  Clinical Data: Symptomatic uterine fibroids, abnormal menstrual bleeding, pelvic patent pressure  UTERINE FIBROID EMBOLIZATION  Date:  10/08/2012 09:30:00  Radiologist:  [facility]  Medications:  2 grams ancefadminstered within one hour of the procedure,4 mg Versed, 200 mcg Fentanyl, 30 mg Toradol  Guidance:  Ultrasound and fluoroscopic  Fluoroscopy time:  14 minutes  Sedation time:  80 minutes  Contrast volume:  80 ml Omnipaque-300  Complications:  No immediate  PROCEDURE/FINDINGS:  Informed consent was obtained from the patient following explanation of  the procedure, risks, benefits and alternatives. The patient understands, agrees and consents for the procedure. All questions were addressed.  A time out was performed.  Maximal barrier sterile technique utilized including caps, mask, sterile gowns, sterile gloves, large sterile drape, hand hygiene, and betadine prep.  Under sterile conditions and local anesthesia, bilateralcommon femoral artery access was performed with micropuncture needles. Ultrasound was utilized for access.  Images were obtained for documentation. Bilateral 5-French sheaths were inserted.  A 5-French C2 catheter was utilized to select the contralateral left internal iliac artery.  Selective left internal iliac angiogram was performed.  The tortuous left uterine artery was identified.  Selective catheterization was performed of the left uterine artery with a microcatheter and micro guide wire.  A selective left uterine angiogram was performed.  This demonstrated patency of the left uterine artery.  Mild diffuse hypervascularity of the enlarged fibroid uterus.  Access was adequate for embolization.  In a similar fashion, a C2 catheter was utilized to select the right internal iliac artery.  Selective right internal iliac angiogram was performed.  The patent right uterine artery was identified.  For selective catheterization, the micro catheter and guidewire were utilized to select the right uterine artery. Selective right uterine angiogram was performed.  This demonstrated patency of the right uterine artery.  Catheter position was safe for embolization.  Bilateral embolization was performed to complete stasis with injection of one vial of 500-700 micron Embospheres and one half of the bowelof 700-900 micron Embospheres into each uterine artery.   Post embolization angiogram confirms complete  stasis of the uterine arteries bilaterally.  Microcatheters and C2 catheters were removed.  Injection of the bilateralcommon femoral artery sheaths  confirms access is adequate for Starclose.  Hemostasis was obtained with a 6- [place] Starclose device bilaterally.  No immediate complication. Peripheral pedal pulses are normal +2.  The patient tolerated the procedure well.  No immediate complication.  IMPRESSION: Successful bilateral uterine artery embolization (U F E)   Original Report Authenticated By: Judie Petit. Miles Costain, M.D.    Ir Angiogram Pelvis Selective Or Supraselective  10/08/2012  *RADIOLOGY REPORT*  Clinical Data: Symptomatic uterine fibroids, abnormal menstrual bleeding, pelvic patent pressure  UTERINE FIBROID EMBOLIZATION  Date:  10/08/2012 09:30:00  Radiologist:  [facility]  Medications:  2 grams ancefadminstered within one hour of the procedure,4 mg Versed, 200 mcg Fentanyl, 30 mg Toradol  Guidance:  Ultrasound and fluoroscopic  Fluoroscopy time:  14 minutes  Sedation time:  80 minutes  Contrast volume:  80 ml Omnipaque-300  Complications:  No immediate  PROCEDURE/FINDINGS:  Informed consent was obtained from the patient following explanation of the procedure, risks, benefits and alternatives. The patient understands, agrees and consents for the procedure. All questions were addressed.  A time out was performed.  Maximal barrier sterile technique utilized including caps, mask, sterile gowns, sterile gloves, large sterile drape, hand hygiene, and betadine prep.  Under sterile conditions and local anesthesia, bilateralcommon femoral artery access was performed with micropuncture needles. Ultrasound was utilized for access.  Images were obtained for documentation. Bilateral 5-French sheaths were inserted.  A 5-French C2 catheter was utilized to select the contralateral left internal iliac artery.  Selective left internal iliac angiogram was performed.  The tortuous left uterine artery was identified.  Selective catheterization was performed of the left uterine artery with a microcatheter and micro guide wire.  A selective left uterine angiogram was performed.  This  demonstrated patency of the left uterine artery.  Mild diffuse hypervascularity of the enlarged fibroid uterus.  Access was adequate for embolization.  In a similar fashion, a C2 catheter was utilized to select the right internal iliac artery.  Selective right internal iliac angiogram was performed.  The patent right uterine artery was identified.  For selective catheterization, the micro catheter and guidewire were utilized to select the right uterine artery. Selective right uterine angiogram was performed.  This demonstrated patency of the right uterine artery.  Catheter position was safe for embolization.  Bilateral embolization was performed to complete stasis with injection of one vial of 500-700 micron Embospheres and one half of the bowelof 700-900 micron Embospheres into each uterine artery.   Post embolization angiogram confirms complete stasis of the uterine arteries bilaterally.  Microcatheters and C2 catheters were removed.  Injection of the bilateralcommon femoral artery sheaths confirms access is adequate for Starclose.  Hemostasis was obtained with a 6- [place] Starclose device bilaterally.  No immediate complication. Peripheral pedal pulses are normal +2.  The patient tolerated the procedure well.  No immediate complication.  IMPRESSION: Successful bilateral uterine artery embolization (U F E)   Original Report Authenticated By: Judie Petit. Miles Costain, M.D.    Ir Angiogram Selective Each Additional Vessel  10/08/2012  *RADIOLOGY REPORT*  Clinical Data: Symptomatic uterine fibroids, abnormal menstrual bleeding, pelvic patent pressure  UTERINE FIBROID EMBOLIZATION  Date:  10/08/2012 09:30:00  Radiologist:  [facility]  Medications:  2 grams ancefadminstered within one hour of the procedure,4 mg Versed, 200 mcg Fentanyl, 30 mg Toradol  Guidance:  Ultrasound and fluoroscopic  Fluoroscopy time:  14 minutes  Sedation  time:  80 minutes  Contrast volume:  80 ml Omnipaque-300  Complications:  No immediate  PROCEDURE/FINDINGS:   Informed consent was obtained from the patient following explanation of the procedure, risks, benefits and alternatives. The patient understands, agrees and consents for the procedure. All questions were addressed.  A time out was performed.  Maximal barrier sterile technique utilized including caps, mask, sterile gowns, sterile gloves, large sterile drape, hand hygiene, and betadine prep.  Under sterile conditions and local anesthesia, bilateralcommon femoral artery access was performed with micropuncture needles. Ultrasound was utilized for access.  Images were obtained for documentation. Bilateral 5-French sheaths were inserted.  A 5-French C2 catheter was utilized to select the contralateral left internal iliac artery.  Selective left internal iliac angiogram was performed.  The tortuous left uterine artery was identified.  Selective catheterization was performed of the left uterine artery with a microcatheter and micro guide wire.  A selective left uterine angiogram was performed.  This demonstrated patency of the left uterine artery.  Mild diffuse hypervascularity of the enlarged fibroid uterus.  Access was adequate for embolization.  In a similar fashion, a C2 catheter was utilized to select the right internal iliac artery.  Selective right internal iliac angiogram was performed.  The patent right uterine artery was identified.  For selective catheterization, the micro catheter and guidewire were utilized to select the right uterine artery. Selective right uterine angiogram was performed.  This demonstrated patency of the right uterine artery.  Catheter position was safe for embolization.  Bilateral embolization was performed to complete stasis with injection of one vial of 500-700 micron Embospheres and one half of the bowelof 700-900 micron Embospheres into each uterine artery.   Post embolization angiogram confirms complete stasis of the uterine arteries bilaterally.  Microcatheters and C2 catheters were  removed.  Injection of the bilateralcommon femoral artery sheaths confirms access is adequate for Starclose.  Hemostasis was obtained with a 6- [place] Starclose device bilaterally.  No immediate complication. Peripheral pedal pulses are normal +2.  The patient tolerated the procedure well.  No immediate complication.  IMPRESSION: Successful bilateral uterine artery embolization (U F E)   Original Report Authenticated By: Judie Petit. Miles Costain, M.D.    Ir Angiogram Selective Each Additional Vessel  10/08/2012  *RADIOLOGY REPORT*  Clinical Data: Symptomatic uterine fibroids, abnormal menstrual bleeding, pelvic patent pressure  UTERINE FIBROID EMBOLIZATION  Date:  10/08/2012 09:30:00  Radiologist:  [facility]  Medications:  2 grams ancefadminstered within one hour of the procedure,4 mg Versed, 200 mcg Fentanyl, 30 mg Toradol  Guidance:  Ultrasound and fluoroscopic  Fluoroscopy time:  14 minutes  Sedation time:  80 minutes  Contrast volume:  80 ml Omnipaque-300  Complications:  No immediate  PROCEDURE/FINDINGS:  Informed consent was obtained from the patient following explanation of the procedure, risks, benefits and alternatives. The patient understands, agrees and consents for the procedure. All questions were addressed.  A time out was performed.  Maximal barrier sterile technique utilized including caps, mask, sterile gowns, sterile gloves, large sterile drape, hand hygiene, and betadine prep.  Under sterile conditions and local anesthesia, bilateralcommon femoral artery access was performed with micropuncture needles. Ultrasound was utilized for access.  Images were obtained for documentation. Bilateral 5-French sheaths were inserted.  A 5-French C2 catheter was utilized to select the contralateral left internal iliac artery.  Selective left internal iliac angiogram was performed.  The tortuous left uterine artery was identified.  Selective catheterization was performed of the left uterine artery with a microcatheter  and micro guide  wire.  A selective left uterine angiogram was performed.  This demonstrated patency of the left uterine artery.  Mild diffuse hypervascularity of the enlarged fibroid uterus.  Access was adequate for embolization.  In a similar fashion, a C2 catheter was utilized to select the right internal iliac artery.  Selective right internal iliac angiogram was performed.  The patent right uterine artery was identified.  For selective catheterization, the micro catheter and guidewire were utilized to select the right uterine artery. Selective right uterine angiogram was performed.  This demonstrated patency of the right uterine artery.  Catheter position was safe for embolization.  Bilateral embolization was performed to complete stasis with injection of one vial of 500-700 micron Embospheres and one half of the bowelof 700-900 micron Embospheres into each uterine artery.   Post embolization angiogram confirms complete stasis of the uterine arteries bilaterally.  Microcatheters and C2 catheters were removed.  Injection of the bilateralcommon femoral artery sheaths confirms access is adequate for Starclose.  Hemostasis was obtained with a 6- [place] Starclose device bilaterally.  No immediate complication. Peripheral pedal pulses are normal +2.  The patient tolerated the procedure well.  No immediate complication.  IMPRESSION: Successful bilateral uterine artery embolization (U F E)   Original Report Authenticated By: Judie Petit. Miles Costain, M.D.    Ir US Guide Vasc Access Left  10/08/2012  *RADIOLOGY REPORT*  Clinical Data: Symptomatic uterine fibroids, abnormal menstrual bleeding, pelvic patent pressure  UTERINE FIBROID EMBOLIZATION  Date:  10/08/2012 09:30:00  Radiologist:  [facility]  Medications:  2 grams ancefadminstered within one hour of the procedure,4 mg Versed, 200 mcg Fentanyl, 30 mg Toradol  Guidance:  Ultrasound and fluoroscopic  Fluoroscopy time:  14 minutes  Sedation time:  80 minutes  Contrast volume:  80 ml Omnipaque-300   Complications:  No immediate  PROCEDURE/FINDINGS:  Informed consent was obtained from the patient following explanation of the procedure, risks, benefits and alternatives. The patient understands, agrees and consents for the procedure. All questions were addressed.  A time out was performed.  Maximal barrier sterile technique utilized including caps, mask, sterile gowns, sterile gloves, large sterile drape, hand hygiene, and betadine prep.  Under sterile conditions and local anesthesia, bilateralcommon femoral artery access was performed with micropuncture needles. Ultrasound was utilized for access.  Images were obtained for documentation. Bilateral 5-French sheaths were inserted.  A 5-French C2 catheter was utilized to select the contralateral left internal iliac artery.  Selective left internal iliac angiogram was performed.  The tortuous left uterine artery was identified.  Selective catheterization was performed of the left uterine artery with a microcatheter and micro guide wire.  A selective left uterine angiogram was performed.  This demonstrated patency of the left uterine artery.  Mild diffuse hypervascularity of the enlarged fibroid uterus.  Access was adequate for embolization.  In a similar fashion, a C2 catheter was utilized to select the right internal iliac artery.  Selective right internal iliac angiogram was performed.  The patent right uterine artery was identified.  For selective catheterization, the micro catheter and guidewire were utilized to select the right uterine artery. Selective right uterine angiogram was performed.  This demonstrated patency of the right uterine artery.  Catheter position was safe for embolization.  Bilateral embolization was performed to complete stasis with injection of one vial of 500-700 micron Embospheres and one half of the bowelof 700-900 micron Embospheres into each uterine artery.   Post embolization angiogram confirms complete stasis of the uterine arteries  bilaterally.  Microcatheters and C2 catheters were removed.  Injection of the bilateralcommon femoral artery sheaths confirms access is adequate for Starclose.  Hemostasis was obtained with a 6- [place] Starclose device bilaterally.  No immediate complication. Peripheral pedal pulses are normal +2.  The patient tolerated the procedure well.  No immediate complication.  IMPRESSION: Successful bilateral uterine artery embolization (U F E)   Original Report Authenticated By: Judie Petit. Miles Costain, M.D.    Ir US Guide Vasc Access Right  10/08/2012  *RADIOLOGY REPORT*  Clinical Data: Symptomatic uterine fibroids, abnormal menstrual bleeding, pelvic patent pressure  UTERINE FIBROID EMBOLIZATION  Date:  10/08/2012 09:30:00  Radiologist:  [facility]  Medications:  2 grams ancefadminstered within one hour of the procedure,4 mg Versed, 200 mcg Fentanyl, 30 mg Toradol  Guidance:  Ultrasound and fluoroscopic  Fluoroscopy time:  14 minutes  Sedation time:  80 minutes  Contrast volume:  80 ml Omnipaque-300  Complications:  No immediate  PROCEDURE/FINDINGS:  Informed consent was obtained from the patient following explanation of the procedure, risks, benefits and alternatives. The patient understands, agrees and consents for the procedure. All questions were addressed.  A time out was performed.  Maximal barrier sterile technique utilized including caps, mask, sterile gowns, sterile gloves, large sterile drape, hand hygiene, and betadine prep.  Under sterile conditions and local anesthesia, bilateralcommon femoral artery access was performed with micropuncture needles. Ultrasound was utilized for access.  Images were obtained for documentation. Bilateral 5-French sheaths were inserted.  A 5-French C2 catheter was utilized to select the contralateral left internal iliac artery.  Selective left internal iliac angiogram was performed.  The tortuous left uterine artery was identified.  Selective catheterization was performed of the left uterine artery  with a microcatheter and micro guide wire.  A selective left uterine angiogram was performed.  This demonstrated patency of the left uterine artery.  Mild diffuse hypervascularity of the enlarged fibroid uterus.  Access was adequate for embolization.  In a similar fashion, a C2 catheter was utilized to select the right internal iliac artery.  Selective right internal iliac angiogram was performed.  The patent right uterine artery was identified.  For selective catheterization, the micro catheter and guidewire were utilized to select the right uterine artery. Selective right uterine angiogram was performed.  This demonstrated patency of the right uterine artery.  Catheter position was safe for embolization.  Bilateral embolization was performed to complete stasis with injection of one vial of 500-700 micron Embospheres and one half of the bowelof 700-900 micron Embospheres into each uterine artery.   Post embolization angiogram confirms complete stasis of the uterine arteries bilaterally.  Microcatheters and C2 catheters were removed.  Injection of the bilateralcommon femoral artery sheaths confirms access is adequate for Starclose.  Hemostasis was obtained with a 6- [place] Starclose device bilaterally.  No immediate complication. Peripheral pedal pulses are normal +2.  The patient tolerated the procedure well.  No immediate complication.  IMPRESSION: Successful bilateral uterine artery embolization (U F E)   Original Report Authenticated By: Judie Petit. Miles Costain, M.D.    Ir Embo Tumor Organ Ischemia Infarct Inc Guide Roadmapping  10/08/2012  *RADIOLOGY REPORT*  Clinical Data: Symptomatic uterine fibroids, abnormal menstrual bleeding, pelvic patent pressure  UTERINE FIBROID EMBOLIZATION  Date:  10/08/2012 09:30:00  Radiologist:  [facility]  Medications:  2 grams ancefadminstered within one hour of the procedure,4 mg Versed, 200 mcg Fentanyl, 30 mg Toradol  Guidance:  Ultrasound and fluoroscopic  Fluoroscopy time:  14 minutes   Sedation time:  80 minutes  Contrast volume:  80 ml Omnipaque-300  Complications:  No immediate  PROCEDURE/FINDINGS:  Informed consent was obtained from the patient following explanation of the procedure, risks, benefits and alternatives. The patient understands, agrees and consents for the procedure. All questions were addressed.  A time out was performed.  Maximal barrier sterile technique utilized including caps, mask, sterile gowns, sterile gloves, large sterile drape, hand hygiene, and betadine prep.  Under sterile conditions and local anesthesia, bilateralcommon femoral artery access was performed with micropuncture needles. Ultrasound was utilized for access.  Images were obtained for documentation. Bilateral 5-French sheaths were inserted.  A 5-French C2 catheter was utilized to select the contralateral left internal iliac artery.  Selective left internal iliac angiogram was performed.  The tortuous left uterine artery was identified.  Selective catheterization was performed of the left uterine artery with a microcatheter and micro guide wire.  A selective left uterine angiogram was performed.  This demonstrated patency of the left uterine artery.  Mild diffuse hypervascularity of the enlarged fibroid uterus.  Access was adequate for embolization.  In a similar fashion, a C2 catheter was utilized to select the right internal iliac artery.  Selective right internal iliac angiogram was performed.  The patent right uterine artery was identified.  For selective catheterization, the micro catheter and guidewire were utilized to select the right uterine artery. Selective right uterine angiogram was performed.  This demonstrated patency of the right uterine artery.  Catheter position was safe for embolization.  Bilateral embolization was performed to complete stasis with injection of one vial of 500-700 micron Embospheres and one half of the bowelof 700-900 micron Embospheres into each uterine artery.   Post  embolization angiogram confirms complete stasis of the uterine arteries bilaterally.  Microcatheters and C2 catheters were removed.  Injection of the bilateralcommon femoral artery sheaths confirms access is adequate for Starclose.  Hemostasis was obtained with a 6- [place] Starclose device bilaterally.  No immediate complication. Peripheral pedal pulses are normal +2.  The patient tolerated the procedure well.  No immediate complication.  IMPRESSION: Successful bilateral uterine artery embolization (U F E)   Original Report Authenticated By: Judie Petit. Miles Costain, M.D.     Anti-infectives: Anti-infectives     Start     Dose/Rate Route Frequency Ordered Stop   10/08/12 0845   ceFAZolin (ANCEF) IVPB 2 g/50 mL premix        2 g 100 mL/hr over 30 Minutes Intravenous  Once 10/08/12 1610            Assessment/Plan: s/p bilat uterine artery embolization 1/2 secondary to symptomatic uterine fibroids; d/c PCA morphine pump; IV toradol/oral narcotics for pain, antiemetics prn nausea. Ambulate. If pt tolerates lunch well will plan to d/c home later today. F/u with Dr. Miles Costain in IR clinic in 2 weeks. Pt instructed to monitor BP at home and f/u with primary care MD.  LOS: 1 day    Destyne Goodreau,D [facility] 10/09/2012

## 2012-10-09 NOTE — Discharge Summary (Signed)
Physician Discharge Summary  Patient ID: Mckena Chern MRN: [id number] DOB/AGE: November 17, 1970 42 y.o.  Admit date: 10/08/2012 Discharge date: 10/09/2012  Admission Diagnoses: Symptomatic uterine fibroids(menorrhagia/pelvic pain)  Discharge Diagnoses: Symptomatic uterine fibroids, status post bilateral uterine artery embolization on 10/08/2012 Past Medical History  Diagnosis Date  . H/O thyroid disease   . Hx of migraines   . Abnormal Pap smear 05/2002  . BV (bacterial vaginosis) 06/2004  . Pelvic pain 06/26/2004  . Monilial vaginitis 08/2004  . Constipation 10/2004  . Endometriosis   . H/O: menorrhagia 2011  . Fibroid 12/11/2009     Discharged Condition: good  Hospital Course: Ms. Reitano is a 42 year old African American female who was referred to the interventional radiology service by Dr. Kirkland Hun for further evaluation and treatment of symptomatic uterine fibroids. On 10/08/2012, the patient underwent successful bilateral uterine artery embolization by Dr. Ruel Favors. The procedure was performed under IV conscious sedation and without immediate complications. Post procedure the patient was admitted to the hospital for overnight observation and pain control. She was placed on a morphine PCA pump and given antiemetics as needed for nausea. On the evening of admission the patient did experience some intermittent nausea and vomiting as well as pelvic cramping, primarily in the left lower quadrant.  She also experienced elevations in blood pressure, possibly pain mediated, which required short-term treatment with IV labetalol and oral clonidine . On the day of discharge she was able to ambulate and void without difficulty. She tolerated her diet well. Blood pressure was 122/86.  She was also seen by Dr. Irish Lack and deemed suitable for discharge. She was given prescriptions for Vicodin 5/325 #30 no refills-patient to take 1-2 tablets every 4-6 hours as needed for pain. Phenergan 25 mg #10-  1  tablet every 6 hours as needed for nausea. Ibuprofen 600 mg #20-  1 tablet every 6 hours for the next 5 days then as needed for pain. Colace 100 mg #20- 1 tablet twice daily as needed for constipation. She was also encouraged to take milk of magnesia 30 mL at bedtime if needed for constipation. The patient will be scheduled for followup in the interventional radiology clinic with Dr. Ruel Favors in 2 weeks.  She was encouraged to continue current gynecologic care with Dr. Stefano Gaul and followup with her primary care physician regarding blood pressure management.     Consults: none  Significant Diagnostic Studies:  Results for orders placed during the hospital encounter of 10/08/12  APTT      Component Value Range   aPTT 30  24 - 37 seconds  BASIC METABOLIC PANEL      Component Value Range   Sodium 136  135 - 145 mEq/L   Potassium 3.5  3.5 - 5.1 mEq/L   Chloride 103  96 - 112 mEq/L   CO2 23  19 - 32 mEq/L   Glucose, Bld 111 (*) 70 - 99 mg/dL   BUN 10  6 - 23 mg/dL   Creatinine, Ser 4.09  0.50 - 1.10 mg/dL   Calcium 8.5  8.4 - 81.1 mg/dL   GFR calc non Af Amer 85 (*) >90 mL/min   GFR calc Af Amer >90  >90 mL/min  CBC WITH DIFFERENTIAL      Component Value Range   WBC 4.2  4.0 - 10.5 K/uL   RBC 4.70  3.87 - 5.11 MIL/uL   Hemoglobin 12.2  12.0 - 15.0 g/dL   HCT 91.4  78.2 - 95.6 %  MCV 79.8  78.0 - 100.0 fL   MCH 26.0  26.0 - 34.0 pg   MCHC 32.5  30.0 - 36.0 g/dL   RDW 84.1  66.0 - 63.0 %   Platelets 246  150 - 400 K/uL   Neutrophils Relative 50  43 - 77 %   Neutro Abs 2.1  1.7 - 7.7 K/uL   Lymphocytes Relative 39  12 - 46 %   Lymphs Abs 1.7  0.7 - 4.0 K/uL   Monocytes Relative 6  3 - 12 %   Monocytes Absolute 0.2  0.1 - 1.0 K/uL   Eosinophils Relative 5  0 - 5 %   Eosinophils Absolute 0.2  0.0 - 0.7 K/uL   Basophils Relative 1  0 - 1 %   Basophils Absolute 0.0  0.0 - 0.1 K/uL  HCG, SERUM, QUALITATIVE      Component Value Range   Preg, Serum NEGATIVE  NEGATIVE    PROTIME-INR      Component Value Range   Prothrombin Time 12.0  11.6 - 15.2 seconds   INR 0.89  0.00 - 1.49  GLUCOSE, CAPILLARY      Component Value Range   Glucose-Capillary 133 (*) 70 - 99 mg/dL   Comment 1 Notify RN    GLUCOSE, CAPILLARY      Component Value Range   Glucose-Capillary 102 (*) 70 - 99 mg/dL     Treatments: Ir Angiogram Pelvis Selective Or Supraselective  10/08/2012  *RADIOLOGY REPORT*  Clinical Data: Symptomatic uterine fibroids, abnormal menstrual bleeding, pelvic patent pressure  UTERINE FIBROID EMBOLIZATION  Date:  10/08/2012 09:30:00  Radiologist:  [facility]  Medications:  2 grams ancefadminstered within one hour of the procedure,4 mg Versed, 200 mcg Fentanyl, 30 mg Toradol  Guidance:  Ultrasound and fluoroscopic  Fluoroscopy time:  14 minutes  Sedation time:  80 minutes  Contrast volume:  80 ml Omnipaque-300  Complications:  No immediate  PROCEDURE/FINDINGS:  Informed consent was obtained from the patient following explanation of the procedure, risks, benefits and alternatives. The patient understands, agrees and consents for the procedure. All questions were addressed.  A time out was performed.  Maximal barrier sterile technique utilized including caps, mask, sterile gowns, sterile gloves, large sterile drape, hand hygiene, and betadine prep.  Under sterile conditions and local anesthesia, bilateralcommon femoral artery access was performed with micropuncture needles. Ultrasound was utilized for access.  Images were obtained for documentation. Bilateral 5-French sheaths were inserted.  A 5-French C2 catheter was utilized to select the contralateral left internal iliac artery.  Selective left internal iliac angiogram was performed.  The tortuous left uterine artery was identified.  Selective catheterization was performed of the left uterine artery with a microcatheter and micro guide wire.  A selective left uterine angiogram was performed.  This demonstrated patency of the left  uterine artery.  Mild diffuse hypervascularity of the enlarged fibroid uterus.  Access was adequate for embolization.  In a similar fashion, a C2 catheter was utilized to select the right internal iliac artery.  Selective right internal iliac angiogram was performed.  The patent right uterine artery was identified.  For selective catheterization, the micro catheter and guidewire were utilized to select the right uterine artery. Selective right uterine angiogram was performed.  This demonstrated patency of the right uterine artery.  Catheter position was safe for embolization.  Bilateral embolization was performed to complete stasis with injection of one vial of 500-700 micron Embospheres and one half of the bowelof 700-900 micron  Embospheres into each uterine artery.   Post embolization angiogram confirms complete stasis of the uterine arteries bilaterally.  Microcatheters and C2 catheters were removed.  Injection of the bilateralcommon femoral artery sheaths confirms access is adequate for Starclose.  Hemostasis was obtained with a 6- [place] Starclose device bilaterally.  No immediate complication. Peripheral pedal pulses are normal +2.  The patient tolerated the procedure well.  No immediate complication.  IMPRESSION: Successful bilateral uterine artery embolization (U F E)   Original Report Authenticated By: Judie Petit. Miles Costain, M.D.    Ir Angiogram Pelvis Selective Or Supraselective  10/08/2012  *RADIOLOGY REPORT*  Clinical Data: Symptomatic uterine fibroids, abnormal menstrual bleeding, pelvic patent pressure  UTERINE FIBROID EMBOLIZATION  Date:  10/08/2012 09:30:00  Radiologist:  [facility]  Medications:  2 grams ancefadminstered within one hour of the procedure,4 mg Versed, 200 mcg Fentanyl, 30 mg Toradol  Guidance:  Ultrasound and fluoroscopic  Fluoroscopy time:  14 minutes  Sedation time:  80 minutes  Contrast volume:  80 ml Omnipaque-300  Complications:  No immediate  PROCEDURE/FINDINGS:  Informed consent was obtained  from the patient following explanation of the procedure, risks, benefits and alternatives. The patient understands, agrees and consents for the procedure. All questions were addressed.  A time out was performed.  Maximal barrier sterile technique utilized including caps, mask, sterile gowns, sterile gloves, large sterile drape, hand hygiene, and betadine prep.  Under sterile conditions and local anesthesia, bilateralcommon femoral artery access was performed with micropuncture needles. Ultrasound was utilized for access.  Images were obtained for documentation. Bilateral 5-French sheaths were inserted.  A 5-French C2 catheter was utilized to select the contralateral left internal iliac artery.  Selective left internal iliac angiogram was performed.  The tortuous left uterine artery was identified.  Selective catheterization was performed of the left uterine artery with a microcatheter and micro guide wire.  A selective left uterine angiogram was performed.  This demonstrated patency of the left uterine artery.  Mild diffuse hypervascularity of the enlarged fibroid uterus.  Access was adequate for embolization.  In a similar fashion, a C2 catheter was utilized to select the right internal iliac artery.  Selective right internal iliac angiogram was performed.  The patent right uterine artery was identified.  For selective catheterization, the micro catheter and guidewire were utilized to select the right uterine artery. Selective right uterine angiogram was performed.  This demonstrated patency of the right uterine artery.  Catheter position was safe for embolization.  Bilateral embolization was performed to complete stasis with injection of one vial of 500-700 micron Embospheres and one half of the bowelof 700-900 micron Embospheres into each uterine artery.   Post embolization angiogram confirms complete stasis of the uterine arteries bilaterally.  Microcatheters and C2 catheters were removed.  Injection of the  bilateralcommon femoral artery sheaths confirms access is adequate for Starclose.  Hemostasis was obtained with a 6- [place] Starclose device bilaterally.  No immediate complication. Peripheral pedal pulses are normal +2.  The patient tolerated the procedure well.  No immediate complication.  IMPRESSION: Successful bilateral uterine artery embolization (U F E)   Original Report Authenticated By: Judie Petit. Miles Costain, M.D.    Ir Angiogram Selective Each Additional Vessel  10/08/2012  *RADIOLOGY REPORT*  Clinical Data: Symptomatic uterine fibroids, abnormal menstrual bleeding, pelvic patent pressure  UTERINE FIBROID EMBOLIZATION  Date:  10/08/2012 09:30:00  Radiologist:  [facility]  Medications:  2 grams ancefadminstered within one hour of the procedure,4 mg Versed, 200 mcg Fentanyl, 30 mg Toradol  Guidance:  Ultrasound and fluoroscopic  Fluoroscopy time:  14 minutes  Sedation time:  80 minutes  Contrast volume:  80 ml Omnipaque-300  Complications:  No immediate  PROCEDURE/FINDINGS:  Informed consent was obtained from the patient following explanation of the procedure, risks, benefits and alternatives. The patient understands, agrees and consents for the procedure. All questions were addressed.  A time out was performed.  Maximal barrier sterile technique utilized including caps, mask, sterile gowns, sterile gloves, large sterile drape, hand hygiene, and betadine prep.  Under sterile conditions and local anesthesia, bilateralcommon femoral artery access was performed with micropuncture needles. Ultrasound was utilized for access.  Images were obtained for documentation. Bilateral 5-French sheaths were inserted.  A 5-French C2 catheter was utilized to select the contralateral left internal iliac artery.  Selective left internal iliac angiogram was performed.  The tortuous left uterine artery was identified.  Selective catheterization was performed of the left uterine artery with a microcatheter and micro guide wire.  A selective left  uterine angiogram was performed.  This demonstrated patency of the left uterine artery.  Mild diffuse hypervascularity of the enlarged fibroid uterus.  Access was adequate for embolization.  In a similar fashion, a C2 catheter was utilized to select the right internal iliac artery.  Selective right internal iliac angiogram was performed.  The patent right uterine artery was identified.  For selective catheterization, the micro catheter and guidewire were utilized to select the right uterine artery. Selective right uterine angiogram was performed.  This demonstrated patency of the right uterine artery.  Catheter position was safe for embolization.  Bilateral embolization was performed to complete stasis with injection of one vial of 500-700 micron Embospheres and one half of the bowelof 700-900 micron Embospheres into each uterine artery.   Post embolization angiogram confirms complete stasis of the uterine arteries bilaterally.  Microcatheters and C2 catheters were removed.  Injection of the bilateralcommon femoral artery sheaths confirms access is adequate for Starclose.  Hemostasis was obtained with a 6- [place] Starclose device bilaterally.  No immediate complication. Peripheral pedal pulses are normal +2.  The patient tolerated the procedure well.  No immediate complication.  IMPRESSION: Successful bilateral uterine artery embolization (U F E)   Original Report Authenticated By: Judie Petit. Miles Costain, M.D.    Ir Angiogram Selective Each Additional Vessel  10/08/2012  *RADIOLOGY REPORT*  Clinical Data: Symptomatic uterine fibroids, abnormal menstrual bleeding, pelvic patent pressure  UTERINE FIBROID EMBOLIZATION  Date:  10/08/2012 09:30:00  Radiologist:  [facility]  Medications:  2 grams ancefadminstered within one hour of the procedure,4 mg Versed, 200 mcg Fentanyl, 30 mg Toradol  Guidance:  Ultrasound and fluoroscopic  Fluoroscopy time:  14 minutes  Sedation time:  80 minutes  Contrast volume:  80 ml Omnipaque-300  Complications:   No immediate  PROCEDURE/FINDINGS:  Informed consent was obtained from the patient following explanation of the procedure, risks, benefits and alternatives. The patient understands, agrees and consents for the procedure. All questions were addressed.  A time out was performed.  Maximal barrier sterile technique utilized including caps, mask, sterile gowns, sterile gloves, large sterile drape, hand hygiene, and betadine prep.  Under sterile conditions and local anesthesia, bilateralcommon femoral artery access was performed with micropuncture needles. Ultrasound was utilized for access.  Images were obtained for documentation. Bilateral 5-French sheaths were inserted.  A 5-French C2 catheter was utilized to select the contralateral left internal iliac artery.  Selective left internal iliac angiogram was performed.  The tortuous left uterine artery was identified.  Selective  catheterization was performed of the left uterine artery with a microcatheter and micro guide wire.  A selective left uterine angiogram was performed.  This demonstrated patency of the left uterine artery.  Mild diffuse hypervascularity of the enlarged fibroid uterus.  Access was adequate for embolization.  In a similar fashion, a C2 catheter was utilized to select the right internal iliac artery.  Selective right internal iliac angiogram was performed.  The patent right uterine artery was identified.  For selective catheterization, the micro catheter and guidewire were utilized to select the right uterine artery. Selective right uterine angiogram was performed.  This demonstrated patency of the right uterine artery.  Catheter position was safe for embolization.  Bilateral embolization was performed to complete stasis with injection of one vial of 500-700 micron Embospheres and one half of the bowelof 700-900 micron Embospheres into each uterine artery.   Post embolization angiogram confirms complete stasis of the uterine arteries bilaterally.   Microcatheters and C2 catheters were removed.  Injection of the bilateralcommon femoral artery sheaths confirms access is adequate for Starclose.  Hemostasis was obtained with a 6- [place] Starclose device bilaterally.  No immediate complication. Peripheral pedal pulses are normal +2.  The patient tolerated the procedure well.  No immediate complication.  IMPRESSION: Successful bilateral uterine artery embolization (U F E)   Original Report Authenticated By: Judie Petit. Miles Costain, M.D.    Ir US Guide Vasc Access Left  10/08/2012  *RADIOLOGY REPORT*  Clinical Data: Symptomatic uterine fibroids, abnormal menstrual bleeding, pelvic patent pressure  UTERINE FIBROID EMBOLIZATION  Date:  10/08/2012 09:30:00  Radiologist:  [facility]  Medications:  2 grams ancefadminstered within one hour of the procedure,4 mg Versed, 200 mcg Fentanyl, 30 mg Toradol  Guidance:  Ultrasound and fluoroscopic  Fluoroscopy time:  14 minutes  Sedation time:  80 minutes  Contrast volume:  80 ml Omnipaque-300  Complications:  No immediate  PROCEDURE/FINDINGS:  Informed consent was obtained from the patient following explanation of the procedure, risks, benefits and alternatives. The patient understands, agrees and consents for the procedure. All questions were addressed.  A time out was performed.  Maximal barrier sterile technique utilized including caps, mask, sterile gowns, sterile gloves, large sterile drape, hand hygiene, and betadine prep.  Under sterile conditions and local anesthesia, bilateralcommon femoral artery access was performed with micropuncture needles. Ultrasound was utilized for access.  Images were obtained for documentation. Bilateral 5-French sheaths were inserted.  A 5-French C2 catheter was utilized to select the contralateral left internal iliac artery.  Selective left internal iliac angiogram was performed.  The tortuous left uterine artery was identified.  Selective catheterization was performed of the left uterine artery with a  microcatheter and micro guide wire.  A selective left uterine angiogram was performed.  This demonstrated patency of the left uterine artery.  Mild diffuse hypervascularity of the enlarged fibroid uterus.  Access was adequate for embolization.  In a similar fashion, a C2 catheter was utilized to select the right internal iliac artery.  Selective right internal iliac angiogram was performed.  The patent right uterine artery was identified.  For selective catheterization, the micro catheter and guidewire were utilized to select the right uterine artery. Selective right uterine angiogram was performed.  This demonstrated patency of the right uterine artery.  Catheter position was safe for embolization.  Bilateral embolization was performed to complete stasis with injection of one vial of 500-700 micron Embospheres and one half of the bowelof 700-900 micron Embospheres into each uterine artery.  Post embolization angiogram confirms complete stasis of the uterine arteries bilaterally.  Microcatheters and C2 catheters were removed.  Injection of the bilateralcommon femoral artery sheaths confirms access is adequate for Starclose.  Hemostasis was obtained with a 6- [place] Starclose device bilaterally.  No immediate complication. Peripheral pedal pulses are normal +2.  The patient tolerated the procedure well.  No immediate complication.  IMPRESSION: Successful bilateral uterine artery embolization (U F E)   Original Report Authenticated By: Judie Petit. Miles Costain, M.D.    Ir US Guide Vasc Access Right  10/08/2012  *RADIOLOGY REPORT*  Clinical Data: Symptomatic uterine fibroids, abnormal menstrual bleeding, pelvic patent pressure  UTERINE FIBROID EMBOLIZATION  Date:  10/08/2012 09:30:00  Radiologist:  [facility]  Medications:  2 grams ancefadminstered within one hour of the procedure,4 mg Versed, 200 mcg Fentanyl, 30 mg Toradol  Guidance:  Ultrasound and fluoroscopic  Fluoroscopy time:  14 minutes  Sedation time:  80 minutes  Contrast  volume:  80 ml Omnipaque-300  Complications:  No immediate  PROCEDURE/FINDINGS:  Informed consent was obtained from the patient following explanation of the procedure, risks, benefits and alternatives. The patient understands, agrees and consents for the procedure. All questions were addressed.  A time out was performed.  Maximal barrier sterile technique utilized including caps, mask, sterile gowns, sterile gloves, large sterile drape, hand hygiene, and betadine prep.  Under sterile conditions and local anesthesia, bilateralcommon femoral artery access was performed with micropuncture needles. Ultrasound was utilized for access.  Images were obtained for documentation. Bilateral 5-French sheaths were inserted.  A 5-French C2 catheter was utilized to select the contralateral left internal iliac artery.  Selective left internal iliac angiogram was performed.  The tortuous left uterine artery was identified.  Selective catheterization was performed of the left uterine artery with a microcatheter and micro guide wire.  A selective left uterine angiogram was performed.  This demonstrated patency of the left uterine artery.  Mild diffuse hypervascularity of the enlarged fibroid uterus.  Access was adequate for embolization.  In a similar fashion, a C2 catheter was utilized to select the right internal iliac artery.  Selective right internal iliac angiogram was performed.  The patent right uterine artery was identified.  For selective catheterization, the micro catheter and guidewire were utilized to select the right uterine artery. Selective right uterine angiogram was performed.  This demonstrated patency of the right uterine artery.  Catheter position was safe for embolization.  Bilateral embolization was performed to complete stasis with injection of one vial of 500-700 micron Embospheres and one half of the bowelof 700-900 micron Embospheres into each uterine artery.   Post embolization angiogram confirms complete  stasis of the uterine arteries bilaterally.  Microcatheters and C2 catheters were removed.  Injection of the bilateralcommon femoral artery sheaths confirms access is adequate for Starclose.  Hemostasis was obtained with a 6- [place] Starclose device bilaterally.  No immediate complication. Peripheral pedal pulses are normal +2.  The patient tolerated the procedure well.  No immediate complication.  IMPRESSION: Successful bilateral uterine artery embolization (U F E)   Original Report Authenticated By: Judie Petit. Miles Costain, M.D.    Ir Embo Tumor Organ Ischemia Infarct Inc Guide Roadmapping  10/08/2012  *RADIOLOGY REPORT*  Clinical Data: Symptomatic uterine fibroids, abnormal menstrual bleeding, pelvic patent pressure  UTERINE FIBROID EMBOLIZATION  Date:  10/08/2012 09:30:00  Radiologist:  [facility]  Medications:  2 grams ancefadminstered within one hour of the procedure,4 mg Versed, 200 mcg Fentanyl, 30 mg Toradol  Guidance:  Ultrasound and fluoroscopic  Fluoroscopy time:  14 minutes  Sedation time:  80 minutes  Contrast volume:  80 ml Omnipaque-300  Complications:  No immediate  PROCEDURE/FINDINGS:  Informed consent was obtained from the patient following explanation of the procedure, risks, benefits and alternatives. The patient understands, agrees and consents for the procedure. All questions were addressed.  A time out was performed.  Maximal barrier sterile technique utilized including caps, mask, sterile gowns, sterile gloves, large sterile drape, hand hygiene, and betadine prep.  Under sterile conditions and local anesthesia, bilateralcommon femoral artery access was performed with micropuncture needles. Ultrasound was utilized for access.  Images were obtained for documentation. Bilateral 5-French sheaths were inserted.  A 5-French C2 catheter was utilized to select the contralateral left internal iliac artery.  Selective left internal iliac angiogram was performed.  The tortuous left uterine artery was identified.   Selective catheterization was performed of the left uterine artery with a microcatheter and micro guide wire.  A selective left uterine angiogram was performed.  This demonstrated patency of the left uterine artery.  Mild diffuse hypervascularity of the enlarged fibroid uterus.  Access was adequate for embolization.  In a similar fashion, a C2 catheter was utilized to select the right internal iliac artery.  Selective right internal iliac angiogram was performed.  The patent right uterine artery was identified.  For selective catheterization, the micro catheter and guidewire were utilized to select the right uterine artery. Selective right uterine angiogram was performed.  This demonstrated patency of the right uterine artery.  Catheter position was safe for embolization.  Bilateral embolization was performed to complete stasis with injection of one vial of 500-700 micron Embospheres and one half of the bowelof 700-900 micron Embospheres into each uterine artery.   Post embolization angiogram confirms complete stasis of the uterine arteries bilaterally.  Microcatheters and C2 catheters were removed.  Injection of the bilateralcommon femoral artery sheaths confirms access is adequate for Starclose.  Hemostasis was obtained with a 6- [place] Starclose device bilaterally.  No immediate complication. Peripheral pedal pulses are normal +2.  The patient tolerated the procedure well.  No immediate complication.  IMPRESSION: Successful bilateral uterine artery embolization (U F E)   Original Report Authenticated By: Judie Petit. Miles Costain, M.D.     Discharge Exam: Blood pressure 122/86, pulse 84, temperature 98.1 F (36.7 C), temperature source Oral, resp. rate 18, height 5' (1.524 m), weight 180 lb (81.647 kg), last menstrual period 09/30/2012, SpO2 99.00%. Patient is awake, alert and oriented. Chest is clear auscultation bilaterally. Heart with regular rate and rhythm. Abdomen soft, fibroid uterus, positive bowel sounds, mild  pelvic tenderness to palpation primarily in the left lower quadrant. Extremities with full range of motion and no significant edema. Intact distal pulses. Both right and left common femoral artery puncture sites are clean, dry, soft, nontender and without hematomas.  Disposition: Home  Discharge Orders    Future Orders Please Complete By Expires   Diet - low sodium heart healthy      Increase activity slowly      Discharge instructions      Comments:   [facility] DEPT WILL CALL YOU WITH FOLLOW UP APPT DATE/TIME WITH DR. SHICK IN 2 WEEKS; TAKE MEDICATIONS AS PRESCRIBED; CONTINUE CURRENT GYN CARE WITH DR . Stefano Gaul. FOLLOW UP WITH YOUR PRIMARY PHYSICIAN REGARDING BLOOD PRESSURE MANAGEMENT.   Driving Restrictions      Comments:   NO DRIVING FOR 24 HOURS   Lifting restrictions      Comments:   NO HEAVY  LIFTING FOR NEXT 3-4 DAYS   Sexual Activity Restrictions      Comments:   NO SEXUAL INTERCOURSE FOR 1 WEEK   Change dressing (specify)      Comments:   MAY CHANGE BANDAID OVER GROIN REGIONS ONCE DAILY FOR NEXT 2 DAYS; MAY WASH AREAS WITH SOAP AND WATER   Discharge patient          Medication List     As of 10/09/2012 11:34 AM    STOP taking these medications         aspirin-acetaminophen-caffeine 250-250-65 MG per tablet   Commonly known as: EXCEDRIN MIGRAINE      naproxen sodium 220 MG tablet   Commonly known as: ANAPROX      TAKE these medications         acetaminophen 325 MG tablet   Commonly known as: TYLENOL   Take 650 mg by mouth every 6 (six) hours as needed. Pain      traMADol 50 MG tablet   Commonly known as: ULTRAM   Take 1 tablet (50 mg total) by mouth every 6 (six) hours as needed for pain.           Follow-up Information    Follow up with Sigurd Sos., MD. ([facility] WILL CALL YOU WITH FOLLOW UP APPT DATE/TIME WITH DR SHICK IN 2 WEEKS; CALL [phone] OR [phone] WITH ANY QUESTIONS)    Contact information:   [address] [place]  [address] [place] [address] [phone]          Signed: Chinita Pester 10/09/2012, 11:34 AM

## 2012-10-09 NOTE — Progress Notes (Signed)
Patient and her mother given discharge instructions using teach back method and they verbalized understanding.  Patient has ambulated in hallway x 2 and tolerated well.  Patient with some nausea that was relieved with po Phenergan.  Patient stable for discharge home.  Assessment unchanged from this am.  Allayne Butcher [facility]  10/09/2012

## 2012-10-19 ENCOUNTER — Telehealth: Payer: Self-pay | Admitting: Emergency Medicine

## 2012-10-19 NOTE — Telephone Encounter (Signed)
PT CALLED W/ C/C OF LT SIDED PAIN AND RADIATES DOWN HER LEFT LEG.    PAGED KEVIN BRUNING, PA- HE WILL CONTACT PT.

## 2012-10-20 ENCOUNTER — Other Ambulatory Visit: Payer: Self-pay | Admitting: Interventional Radiology

## 2012-10-20 ENCOUNTER — Ambulatory Visit
Admission: RE | Admit: 2012-10-20 | Discharge: 2012-10-20 | Disposition: A | Discharge status: Home / Self Care | Payer: BC Managed Care – PPO | Source: Ambulatory Visit | Attending: Interventional Radiology | Admitting: Interventional Radiology

## 2012-10-20 DIAGNOSIS — D259 Leiomyoma of uterus, unspecified: Secondary | ICD-10-CM

## 2012-10-20 DIAGNOSIS — R52 Pain, unspecified: Secondary | ICD-10-CM

## 2012-10-20 NOTE — Progress Notes (Signed)
S/p [place] on 10/08/2012.  LMP:  10/01/2012-10/12/2012.  Pt states that she experienced spotting from date of procedure through 10/12/2012.  No foul odor.    Patient states that she completed Motrin Rx on 10/15/2012.    States that started cramping on 10/16/2012.  Taking Excedrin as needed for cramping (usually 8-12 hrs).    States that she is concerned about sharp Left pelvic discomfort w/ radiation to Left Leg.  States that she has been awakened for the last 3 nights w/ this pain.    Afebrile.  Denies nausea, vomiting or diarrhea.     

## 2012-10-20 NOTE — Progress Notes (Signed)
S/p [place] on 10/08/2012.  LMP:  10/01/2012-10/12/2012.  Pt states that she experienced spotting from date of procedure through 10/12/2012.  No foul odor.    Patient states that she completed Motrin Rx on 10/15/2012.    States that started cramping on 10/16/2012.  Taking Excedrin as needed for cramping (usually 8-12 hrs).    States that she is concerned about sharp Left pelvic discomfort w/ radiation to Left Leg.  States that she has been awakened for the last 3 nights w/ this pain.    Afebrile.  Denies nausea, vomiting or diarrhea.

## 2012-11-04 ENCOUNTER — Ambulatory Visit
Admission: RE | Admit: 2012-11-04 | Discharge: 2012-11-04 | Disposition: A | Discharge status: Home / Self Care | Payer: BC Managed Care – PPO | Source: Ambulatory Visit | Attending: Radiology | Admitting: Radiology

## 2012-11-04 ENCOUNTER — Other Ambulatory Visit: Payer: BC Managed Care – PPO

## 2012-11-04 ENCOUNTER — Other Ambulatory Visit: Payer: Self-pay | Admitting: Radiology

## 2012-11-04 DIAGNOSIS — D259 Leiomyoma of uterus, unspecified: Secondary | ICD-10-CM

## 2012-11-25 ENCOUNTER — Ambulatory Visit: Payer: BC Managed Care – PPO | Admitting: Obstetrics and Gynecology

## 2012-11-25 ENCOUNTER — Encounter: Payer: Self-pay | Admitting: Obstetrics and Gynecology

## 2012-11-25 VITALS — BP 110/72 | HR 78 | Ht 61.5 in | Wt 194.0 lb

## 2012-11-25 DIAGNOSIS — B9689 Other specified bacterial agents as the cause of diseases classified elsewhere: Secondary | ICD-10-CM

## 2012-11-25 DIAGNOSIS — N898 Other specified noninflammatory disorders of vagina: Secondary | ICD-10-CM

## 2012-11-25 DIAGNOSIS — Z113 Encounter for screening for infections with a predominantly sexual mode of transmission: Secondary | ICD-10-CM

## 2012-11-25 LAB — POCT WET PREP (WET MOUNT)
Whiff Test: POSITIVE
pH: 5.5

## 2012-11-25 MED ORDER — FLUCONAZOLE 150 MG PO TABS: 150.0000 mg | ORAL_TABLET | Freq: Once | ORAL | Status: DC

## 2012-11-25 MED ORDER — METRONIDAZOLE 500 MG PO TABS: 500.0000 mg | ORAL_TABLET | Freq: Two times a day (BID) | ORAL | Status: DC

## 2012-11-25 NOTE — Progress Notes (Signed)
42 YO with an irritating malodorous vaginal discharge x 1 month. Has used vaginal wipes but no other OTC treatment.  Patient also wants to be checked for STDs. Patient is S/P Fibroid Embolization-January 2014.  Menstrual flow x 7 days with pad change every 2 hours with severe cramps.  But cramps were less severe last month after the embolization.  Denies other pelvic pain, urinary/bowel symptoms.   O: Abdomen: soft, non-tender without masses  Pelvic: EGBUS-wnl, vagina-normal with thin grey discharge, cervix-no lesions and without tenderness, uterus-12-14 weeks  size and adnexae-no tenderness or masses  Wet Prep:  ph-5.5,  whiff-positive,  few clue cells  A: Bacterial Vaginosis     S/P Fibroid Embolization  P: Metronidazole 500 mg #14 bid x 7 days no refills      Diflucan 150 mg #1 1 po stat 1 rf      Perineal hygiene      RTO-as scheduled or prn  Berneice Zettlemoyer, PA-C

## 2012-11-25 NOTE — Progress Notes (Signed)
Vaginal discharge: whitethick mucoid Itching / Burning: yes Fever: no  Symptoms have been present for 1 month Has used over-the-counter treatment: yes vaginal wipes Associated symptoms:  Pelvic pain: no       Dyspareunia: no     Odor:  yes  History of STD:  no history of PID, STD's STD screen:requested gc/ct,hepb,hepc,rpr,hsv2,hiv

## 2012-11-26 LAB — RPR

## 2012-11-26 LAB — GC/CHLAMYDIA PROBE AMP: GC Probe RNA: NEGATIVE

## 2012-11-26 LAB — HEPATITIS B SURFACE ANTIGEN: Hepatitis B Surface Ag: NEGATIVE

## 2012-11-27 ENCOUNTER — Telehealth: Payer: Self-pay

## 2012-11-27 NOTE — Telephone Encounter (Signed)
Message copied by Rolla Plate on Fri Nov 27, 2012  2:00 PM ------      Message from: Winfred Leeds      Created: Fri Nov 27, 2012 11:23 AM      Regarding: positive chlamydia, and hsv 2                   ----- Message -----         From: Henreitta Leber, PA         Sent: 11/27/2012  10:24 AM           To: Winfred Leeds, CMA            Please advise patient of positive chlamydia, review STD protocol and treat with Zithromax 500 mg  #2   2 po stat after a meal no refills.  Patient also has a positive HSV-2.  She may review the websites:  valtrex.com and/or [email or web address] for more information.  If she chooses, she may make an appointment to discuss.  Thank you,  EP ------

## 2012-11-27 NOTE — Telephone Encounter (Signed)
Lm on vm tcb rgd labs 

## 2012-11-30 MED ORDER — AZITHROMYCIN 500 MG PO TABS: ORAL_TABLET | ORAL | Status: DC

## 2012-11-30 NOTE — Telephone Encounter (Signed)
Spoke with pt rgd labs informed lab results informed chlamydia positive std protocol reviewed pt has toc 12/21/12 at 9:30 with ep pt voice understanding

## 2012-12-02 ENCOUNTER — Telehealth: Payer: Self-pay | Admitting: Obstetrics and Gynecology

## 2012-12-02 NOTE — Telephone Encounter (Signed)
TC to pt. States partner told her he had urine and swab testing done and his results were neg. Questioning if this is possible since he is her only partner. Advised cannot verify his results. Questioned if he needed blood tests. Explained how different STD testing was done on different types of specimens. Pt still unsure. States will ask to see partner's results in writing. Second call to pt. Lm to return call. Need to inform pt results do not indicate when pt was infected.

## 2012-12-02 NOTE — Telephone Encounter (Signed)
TC from pt. Also clarified that unless pt has had previous neg result and/ot has had more  than one partner, cannot verify when was infected. Pt verbalizes comprehension.

## 2012-12-21 ENCOUNTER — Encounter: Payer: Self-pay | Admitting: Obstetrics and Gynecology

## 2012-12-21 ENCOUNTER — Ambulatory Visit: Payer: BC Managed Care – PPO | Admitting: Obstetrics and Gynecology

## 2012-12-21 VITALS — BP 126/80 | Temp 98.3°F | Ht 62.0 in | Wt 195.0 lb

## 2012-12-21 DIAGNOSIS — Z8619 Personal history of other infectious and parasitic diseases: Secondary | ICD-10-CM

## 2012-12-21 NOTE — Progress Notes (Signed)
Pt is here for TOC for Chlamydia

## 2012-12-21 NOTE — Progress Notes (Signed)
42 YO for test of cure for chlamydia.  Denies any complaints.   O:  Abdomen: soft, non-tender without masses       Pelvic: EGBUS-wnl, vagina-normal, cervix-no lesions and without tenderness, uterus-normal size and adnexae-no tenderness or masses   A: Chlamydia Test of Cure  P:  Chlamydia pending       RTO-as scheduled or prn  Amori Cooperman, PA-C

## 2013-01-19 ENCOUNTER — Telehealth: Payer: Self-pay | Admitting: Emergency Medicine

## 2013-01-19 NOTE — Telephone Encounter (Signed)
 F/U PHONE CALL FOR [place] BY DR [facility].   IF PT DOES NOT WISH TO CALL BACK WITH STATUS UPDATE, THEN WE WILL CONTACT HER FOR HER F/U APPT.

## 2013-04-01 ENCOUNTER — Other Ambulatory Visit (HOSPITAL_COMMUNITY): Payer: Self-pay | Admitting: Interventional Radiology

## 2013-04-01 DIAGNOSIS — N920 Excessive and frequent menstruation with regular cycle: Secondary | ICD-10-CM

## 2013-04-01 DIAGNOSIS — D259 Leiomyoma of uterus, unspecified: Secondary | ICD-10-CM

## 2013-04-13 ENCOUNTER — Other Ambulatory Visit (HOSPITAL_COMMUNITY): Payer: Self-pay | Admitting: Interventional Radiology

## 2013-04-13 DIAGNOSIS — N920 Excessive and frequent menstruation with regular cycle: Secondary | ICD-10-CM

## 2013-04-13 DIAGNOSIS — D259 Leiomyoma of uterus, unspecified: Secondary | ICD-10-CM

## 2013-05-17 ENCOUNTER — Telehealth: Payer: Self-pay | Admitting: Emergency Medicine

## 2013-05-17 NOTE — Telephone Encounter (Signed)
LMOVM TO CK STATUS OF PT POST [place].  IF SHE IS ASYMPTOMATIC THEN SHE CAN JUST F/U WITH HER GYN ON AN ANNUAL BASIS.  IF SHE WANTS TO SEE DR SHICK WE WILL BE GLAD TO MAKE AN APPT.

## 2013-10-08 DIAGNOSIS — D250 Submucous leiomyoma of uterus: Secondary | ICD-10-CM | POA: Insufficient documentation

## 2017-08-10 ENCOUNTER — Emergency Department
Admission: EM | Admit: 2017-08-10 | Discharge: 2017-08-10 | Disposition: A | Discharge status: Home / Self Care | Payer: BC Managed Care – PPO | Attending: Emergency Medicine | Admitting: Emergency Medicine

## 2017-08-10 ENCOUNTER — Other Ambulatory Visit: Payer: Self-pay

## 2017-08-10 ENCOUNTER — Encounter: Payer: Self-pay | Admitting: Emergency Medicine

## 2017-08-10 DIAGNOSIS — Y9289 Other specified places as the place of occurrence of the external cause: Secondary | ICD-10-CM | POA: Insufficient documentation

## 2017-08-10 DIAGNOSIS — R51 Headache: Secondary | ICD-10-CM | POA: Insufficient documentation

## 2017-08-10 DIAGNOSIS — Y92410 Unspecified street and highway as the place of occurrence of the external cause: Secondary | ICD-10-CM | POA: Insufficient documentation

## 2017-08-10 DIAGNOSIS — Y999 Unspecified external cause status: Secondary | ICD-10-CM | POA: Diagnosis not present

## 2017-08-10 DIAGNOSIS — S29012A Strain of muscle and tendon of back wall of thorax, initial encounter: Secondary | ICD-10-CM | POA: Insufficient documentation

## 2017-08-10 DIAGNOSIS — S3992XA Unspecified injury of lower back, initial encounter: Secondary | ICD-10-CM | POA: Diagnosis present

## 2017-08-10 DIAGNOSIS — S29019A Strain of muscle and tendon of unspecified wall of thorax, initial encounter: Secondary | ICD-10-CM

## 2017-08-10 DIAGNOSIS — Y9389 Activity, other specified: Secondary | ICD-10-CM | POA: Diagnosis not present

## 2017-08-10 LAB — POCT PREGNANCY, URINE: PREG TEST UR: NEGATIVE

## 2017-08-10 MED ORDER — KETOROLAC TROMETHAMINE 30 MG/ML IJ SOLN
30.0000 mg | Freq: Once | INTRAMUSCULAR | Status: AC
  Administered 2017-08-10: 30 mg via INTRAMUSCULAR
  Filled 2017-08-10: qty 1

## 2017-08-10 MED ORDER — METHOCARBAMOL 500 MG PO TABS: ORAL_TABLET | ORAL | 0 refills | Status: DC

## 2017-08-10 MED ORDER — IBUPROFEN 600 MG PO TABS: 600.0000 mg | ORAL_TABLET | Freq: Three times a day (TID) | ORAL | 0 refills | Status: DC | PRN
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
## 2017-08-10 NOTE — Discharge Instructions (Signed)
Follow-up with your primary care provider if any continued problems. Begin taking ibuprofen 600 mg 3 times a day with food. Also take Robaxin 500 mg 1 or 2 tablets every 6 hours as needed for muscle spasms. Do not take a muscle relaxant prior to driving or operating machinery. You may use  ice or heat to your back as needed for discomfort.
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
## 2017-09-27 DIAGNOSIS — I10 Essential (primary) hypertension: Secondary | ICD-10-CM | POA: Insufficient documentation

## 2017-10-15 ENCOUNTER — Other Ambulatory Visit: Payer: Self-pay | Admitting: Internal Medicine

## 2017-10-15 DIAGNOSIS — Z1231 Encounter for screening mammogram for malignant neoplasm of breast: Secondary | ICD-10-CM

## 2017-10-31 ENCOUNTER — Ambulatory Visit
Admission: RE | Admit: 2017-10-31 | Discharge: 2017-10-31 | Disposition: A | Discharge status: Home / Self Care | Payer: BC Managed Care – PPO | Source: Ambulatory Visit | Attending: Internal Medicine | Admitting: Internal Medicine

## 2017-10-31 DIAGNOSIS — Z1231 Encounter for screening mammogram for malignant neoplasm of breast: Secondary | ICD-10-CM | POA: Insufficient documentation

## 2017-11-10 ENCOUNTER — Other Ambulatory Visit: Payer: Self-pay | Admitting: *Deleted

## 2017-11-10 ENCOUNTER — Inpatient Hospital Stay
Admission: RE | Admit: 2017-11-10 | Discharge: 2017-11-10 | Disposition: A | Discharge status: Home / Self Care | Payer: Self-pay | Source: Ambulatory Visit | Attending: *Deleted | Admitting: *Deleted

## 2017-11-10 DIAGNOSIS — Z9289 Personal history of other medical treatment: Secondary | ICD-10-CM

## 2018-11-25 ENCOUNTER — Other Ambulatory Visit: Payer: Self-pay

## 2018-11-25 ENCOUNTER — Other Ambulatory Visit: Payer: Self-pay | Admitting: Family Medicine

## 2018-11-25 DIAGNOSIS — Z1231 Encounter for screening mammogram for malignant neoplasm of breast: Secondary | ICD-10-CM

## 2018-12-07 ENCOUNTER — Ambulatory Visit
Admission: RE | Admit: 2018-12-07 | Discharge: 2018-12-07 | Disposition: A | Discharge status: Home / Self Care | Payer: BC Managed Care – PPO | Source: Ambulatory Visit | Attending: Family Medicine | Admitting: Family Medicine

## 2018-12-07 DIAGNOSIS — Z1231 Encounter for screening mammogram for malignant neoplasm of breast: Secondary | ICD-10-CM | POA: Diagnosis present

## 2019-11-07 IMAGING — MG DIGITAL SCREENING BILATERAL MAMMOGRAM WITH TOMO AND CAD
8 series · 8 of 24 positions shown · non-contrast
Comparison: Previous exam(s).

CLINICAL DATA: Screening.

EXAM:
DIGITAL SCREENING BILATERAL MAMMOGRAM WITH TOMO AND CAD

[R MLO synth-2D]
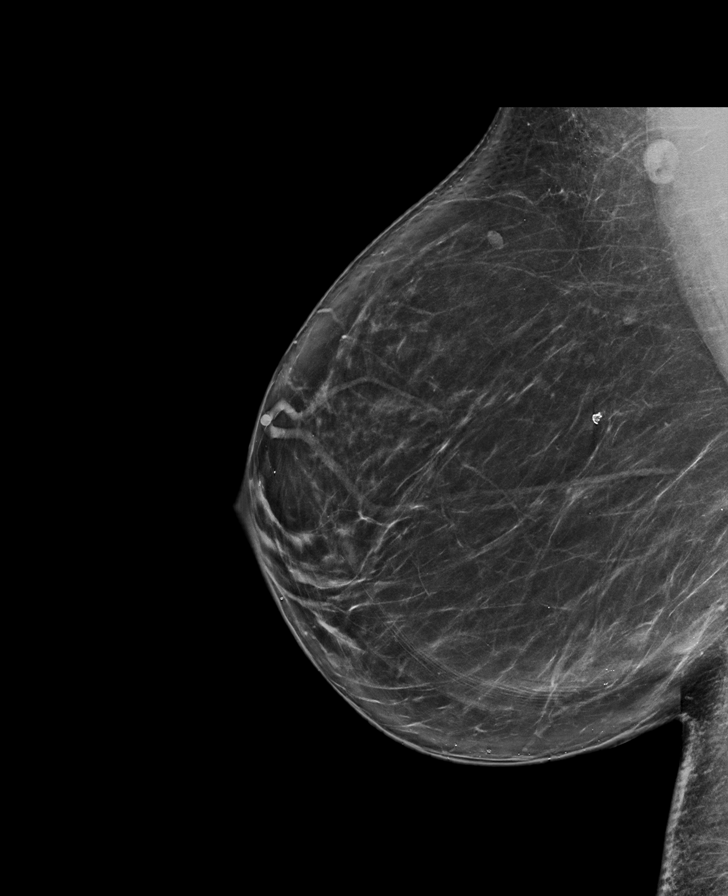

[L CC synth-2D]
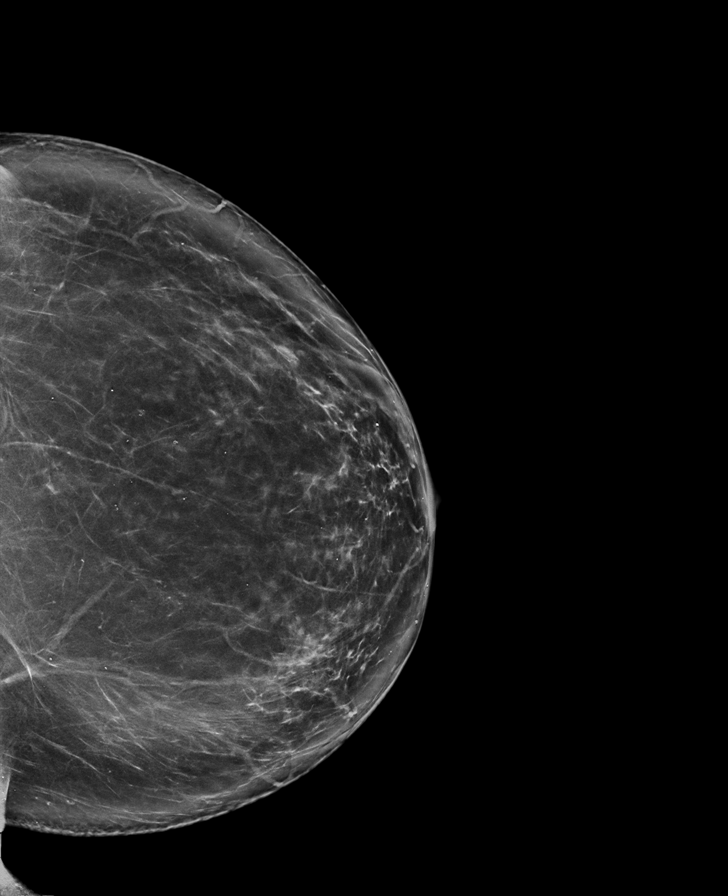

[L MLO synth-2D]
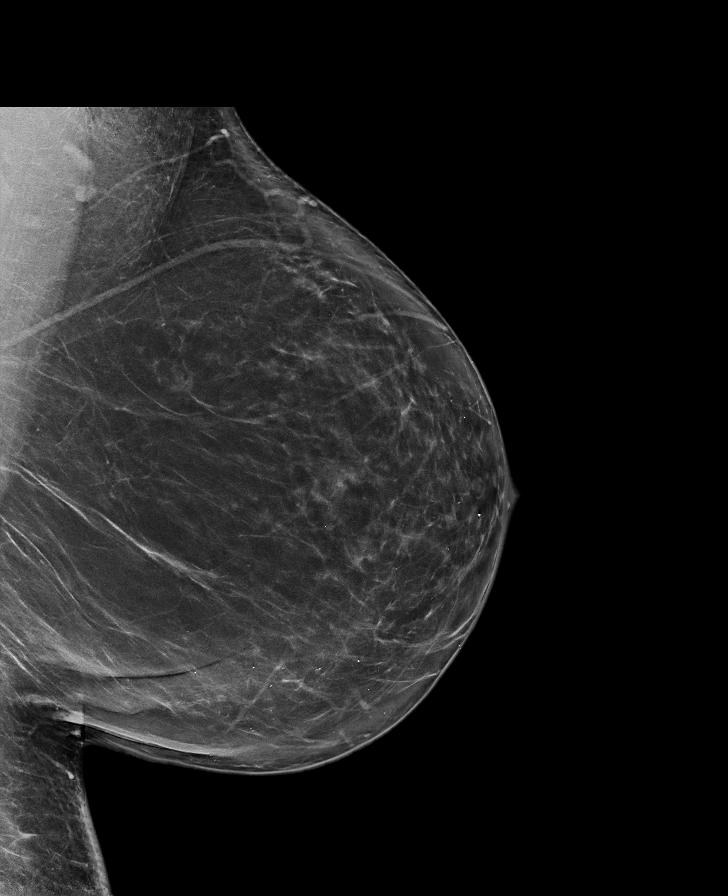

[R CC synth-2D]
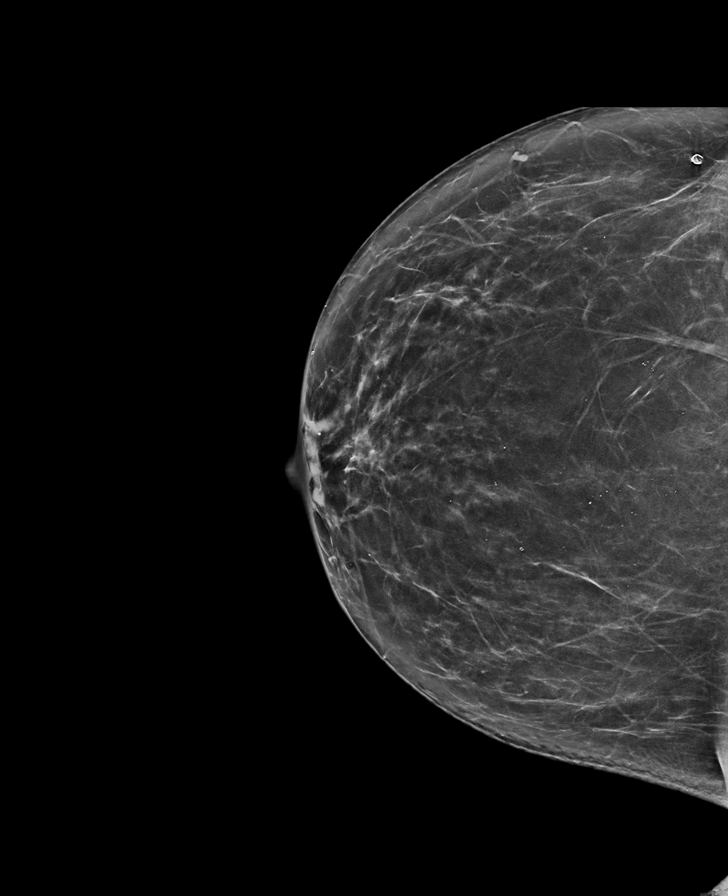

[L CC tomo · tomo slice 43/86.0]
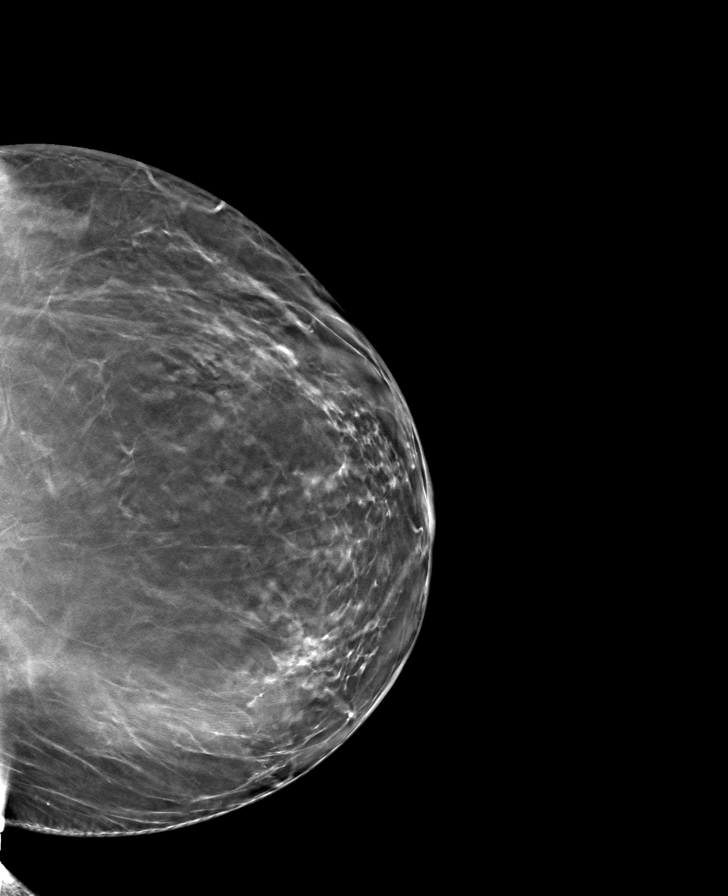

[R MLO tomo · tomo slice 45/90.0]
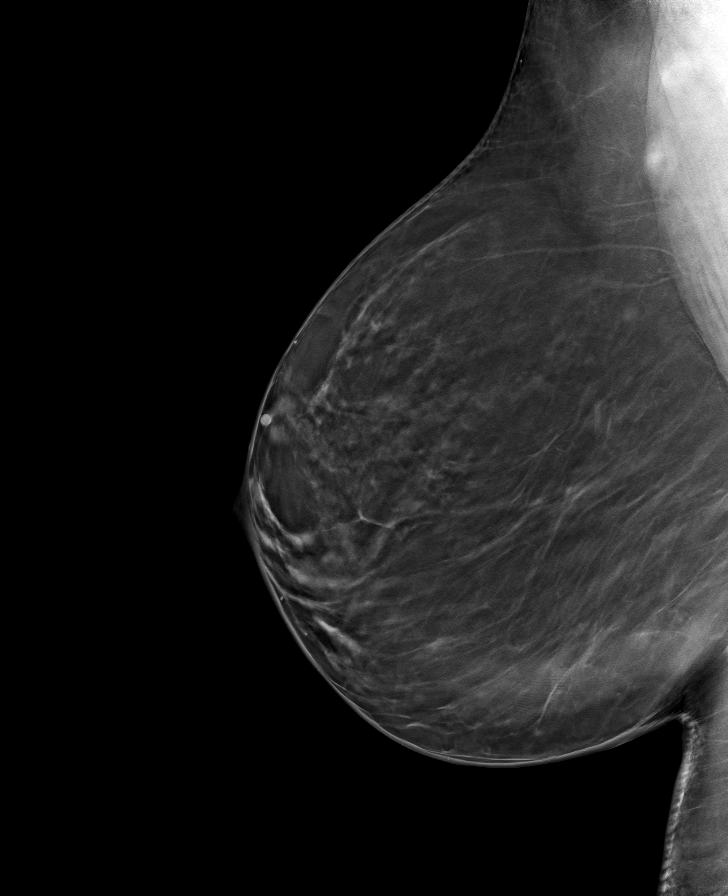

[L MLO tomo · tomo slice 49/96.0]
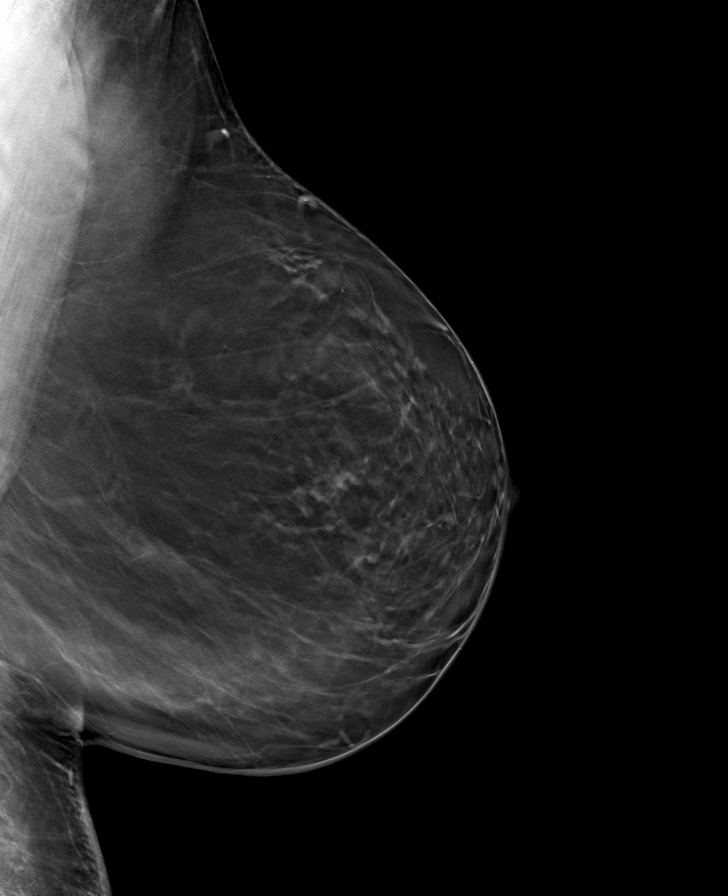

[R CC tomo · tomo slice 39/77.0]
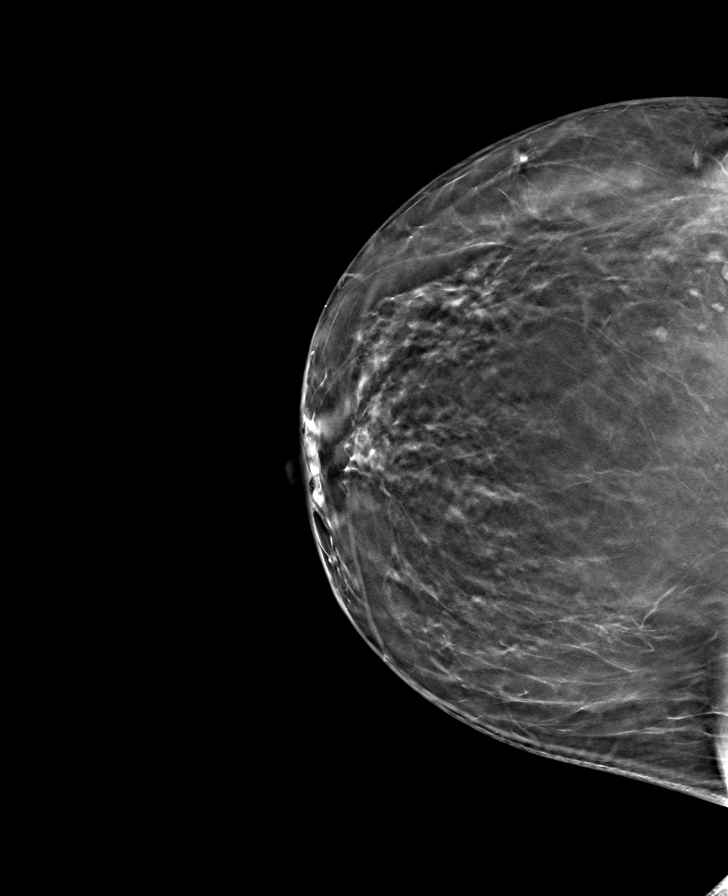

[8 of 24 positions shown; findings below may reference images not displayed]

ACR Breast Density Category b: There are scattered areas of
fibroglandular density.
FINDINGS: There are no findings suspicious for malignancy. Images were
processed with CAD.
IMPRESSION: No mammographic evidence of malignancy. A result letter of this
screening mammogram will be mailed directly to the patient.

RECOMMENDATION:
Screening mammogram in one year. (Code:CN-U-775)

BI-RADS CATEGORY  1: Negative.

## 2020-04-12 ENCOUNTER — Other Ambulatory Visit: Payer: Self-pay | Admitting: Family Medicine

## 2020-04-12 DIAGNOSIS — Z1231 Encounter for screening mammogram for malignant neoplasm of breast: Secondary | ICD-10-CM

## 2020-04-21 ENCOUNTER — Ambulatory Visit
Admission: RE | Admit: 2020-04-21 | Discharge: 2020-04-21 | Disposition: A | Discharge status: Home / Self Care | Payer: BC Managed Care – PPO | Source: Ambulatory Visit | Attending: Family Medicine | Admitting: Family Medicine

## 2020-04-21 DIAGNOSIS — Z1231 Encounter for screening mammogram for malignant neoplasm of breast: Secondary | ICD-10-CM

## 2020-11-14 LAB — COLOGUARD

## 2020-11-23 LAB — COLOGUARD: COLOGUARD: NEGATIVE

## 2021-06-08 ENCOUNTER — Other Ambulatory Visit: Payer: Self-pay | Admitting: Obstetrics & Gynecology

## 2021-06-08 DIAGNOSIS — Z1231 Encounter for screening mammogram for malignant neoplasm of breast: Secondary | ICD-10-CM

## 2021-06-21 ENCOUNTER — Ambulatory Visit
Admission: RE | Admit: 2021-06-21 | Discharge: 2021-06-21 | Disposition: A | Discharge status: Home / Self Care | Payer: BC Managed Care – PPO | Source: Ambulatory Visit | Attending: Obstetrics & Gynecology | Admitting: Obstetrics & Gynecology

## 2021-06-21 ENCOUNTER — Other Ambulatory Visit: Payer: Self-pay

## 2021-06-21 DIAGNOSIS — Z1231 Encounter for screening mammogram for malignant neoplasm of breast: Secondary | ICD-10-CM | POA: Diagnosis present

## 2021-06-25 ENCOUNTER — Other Ambulatory Visit (HOSPITAL_BASED_OUTPATIENT_CLINIC_OR_DEPARTMENT_OTHER): Payer: Self-pay | Admitting: Orthopaedic Surgery

## 2021-06-25 DIAGNOSIS — M542 Cervicalgia: Secondary | ICD-10-CM

## 2021-06-25 DIAGNOSIS — M25512 Pain in left shoulder: Secondary | ICD-10-CM

## 2021-06-26 ENCOUNTER — Other Ambulatory Visit: Payer: Self-pay

## 2021-06-26 ENCOUNTER — Encounter (HOSPITAL_BASED_OUTPATIENT_CLINIC_OR_DEPARTMENT_OTHER): Payer: Self-pay | Admitting: Orthopaedic Surgery

## 2021-06-26 ENCOUNTER — Ambulatory Visit (HOSPITAL_BASED_OUTPATIENT_CLINIC_OR_DEPARTMENT_OTHER): Payer: BC Managed Care – PPO | Admitting: Orthopaedic Surgery

## 2021-06-26 ENCOUNTER — Ambulatory Visit (HOSPITAL_BASED_OUTPATIENT_CLINIC_OR_DEPARTMENT_OTHER)
Admission: RE | Admit: 2021-06-26 | Discharge: 2021-06-26 | Disposition: A | Discharge status: Home / Self Care | Payer: BC Managed Care – PPO | Source: Ambulatory Visit | Attending: Orthopaedic Surgery | Admitting: Orthopaedic Surgery

## 2021-06-26 VITALS — BP 160/113 | Ht 60.0 in | Wt 192.0 lb

## 2021-06-26 DIAGNOSIS — M25512 Pain in left shoulder: Secondary | ICD-10-CM

## 2021-06-26 DIAGNOSIS — M7522 Bicipital tendinitis, left shoulder: Secondary | ICD-10-CM | POA: Diagnosis not present

## 2021-06-26 MED ORDER — LIDOCAINE HCL 1 % IJ SOLN
4.0000 mL | INTRAMUSCULAR | Status: AC | PRN
  Administered 2021-06-26: 4 mL

## 2021-06-26 MED ORDER — TRIAMCINOLONE ACETONIDE 40 MG/ML IJ SUSP
80.0000 mg | INTRAMUSCULAR | Status: AC | PRN
Start: 2021-06-26 — End: 2021-06-26
  Administered 2021-06-26: 80 mg via INTRA_ARTICULAR

## 2021-06-26 NOTE — Progress Notes (Signed)
Chief Complaint: Left shoulder pain     History of Present Illness:   Pain Score: 10/10 SANE: 75/100  Tammy Jenkins is a 50 y.o. female with a history of left shoulder pain that has been going on since June 21, 2021.  She states that she was playing basketball while on vacation and began to experience anterior B shoulder pain.  She denies any previous issues with his left shoulder.  She did not have any specific trauma or have any specific injury during basketball just noticed that it was flared up after.  She has tried some Aleve which is eased her pain a little bit.  She says that the pain is sharp and constant.  It it is difficult to lay on the side directly.  She is right-hand dominant.    Surgical History:   None  PMH/PSH/Family History/Social History/Meds/Allergies:    Past Medical History:  Diagnosis Date  . Abnormal Pap smear 05/2002  . BV (bacterial vaginosis) 06/2004  . Constipation 10/2004  . Endometriosis   . Fibroid 12/11/2009  . H/O thyroid disease   . H/O: menorrhagia 2011  . Hx of migraines   . Monilial vaginitis 08/2004  . Pelvic pain 06/26/2004   Past Surgical History:  Procedure Laterality Date  . REDUCTION MAMMAPLASTY Bilateral 2000   Social History   Socioeconomic History  . Marital status: Single    Spouse name: Not on file  . Number of children: Not on file  . Years of education: Not on file  . Highest education level: Not on file  Occupational History  . Not on file  Tobacco Use  . Smoking status: Never  . Smokeless tobacco: Never  Substance and Sexual Activity  . Alcohol use: No  . Drug use: No  . Sexual activity: Yes    Birth control/protection: None  Other Topics Concern  . Not on file  Social History Narrative  . Not on file   Social Determinants of Health   Financial Resource Strain: Not on file  Food Insecurity: Not on file  Transportation Needs: Not on file  Physical Activity: Not on file   Stress: Not on file  Social Connections: Not on file   Family History  Problem Relation Age of Onset  . Diabetes Maternal Grandmother   . Diabetes Father   . Hypertension Father   . Hypertension Mother   . Breast cancer Cousin 30   No Known Allergies Current Outpatient Medications  Medication Sig Dispense Refill  . acetaminophen (TYLENOL) 325 MG tablet Take 650 mg by mouth every 6 (six) hours as needed. Pain    . ibuprofen (ADVIL,MOTRIN) 600 MG tablet Take 1 tablet (600 mg total) every 8 (eight) hours as needed by mouth. 30 tablet 0  . methocarbamol (ROBAXIN) 500 MG tablet 1-2 tablets q 6 hours prn muscle spasms 20 tablet 0   No current facility-administered medications for this visit.   No results found.  Review of Systems:   A ROS was performed including pertinent positives and negatives as documented in the HPI.  Physical Exam :   Constitutional: NAD and appears stated age Neurological: Alert and oriented Psych: Appropriate affect and cooperative Blood pressure (!) 160/113, height 5' (1.524 m), weight 192 lb (87.1 kg).   Comprehensive Musculoskeletal Exam:    Musculoskeletal Exam  Inspection Right Left  Skin No atrophy or winging No atrophy or winging  Palpation    Tenderness None Biceps  Range of Motion    Flexion (passive) 170 170  Flexion (active) 170 170  Abduction 170 170  ER at the side 70 70  Can reach behind back to T12 L5 with pain over biceps  Strength     Full Full but with pain over biceps  Special Tests    Pseudoparalytic No No  Neurologic    Fires PIN, radial, median, ulnar, musculocutaneous, axillary, suprascapular, long thoracic, and spinal accessory innervated muscles. No abnormal sensibility  Vascular/Lymphatic    Radial Pulse 2+ 2+  Cervical Exam    Patient has symmetric cervical range of motion with negative Spurling's test.  Special Test: Positive speeds     Imaging:   Xray (3 views left shoulder): Normal  I personally reviewed  and interpreted the radiographs.   Assessment:   50 year old female with anterior based left shoulder pain over the biceps tendon following playing basketball 1 week prior.  At this point I believe that we could perform a biceps injection today to help with her pain.  This will likely resolve her issues completely.  We did describe that occasionally biceps tendinitis runs with a small rotator cuff tear.  I think this is unlikely based on her strength and lack of traumatic mechanism.  That being said she will report back to [place] in 4 to 6 weeks if she is not having complete relief with the injection and we would consider an MRI at that time.  Plan :    -Left biceps ultrasound-guided injection performed today -She will reevaluate in 4 to 6 weeks and make a return appointment if no improvement    Procedure Note  Patient: Tammy Jenkins             Date of Birth: 10-18-1970           MRN: [id number]             Visit Date: 06/26/2021  Procedures: Visit Diagnoses: No diagnosis found.  Large Joint Inj on 06/26/2021 9:00 AM Indications: pain Details: 22 G 1.5 in needle, ultrasound-guided anterior approach  Arthrogram: No  Medications: 4 mL lidocaine 1 %; 80 mg triamcinolone acetonide 40 MG/ML Outcome: tolerated well, no immediate complications  Right biceps Procedure, treatment alternatives, risks and benefits explained, specific risks discussed. Consent was given by the patient. Immediately prior to procedure a time out was called to verify the correct patient, procedure, equipment, support staff and site/side marked as required. Patient was prepped and draped in the usual sterile fashion.         I personally saw and evaluated the patient, and participated in the management and treatment plan.  Vanetta Mulders, MD Attending Physician, Orthopedic Surgery  This document was dictated using Dragon voice recognition software. A reasonable attempt at proof reading has been made to  minimize errors.

## 2022-04-13 ENCOUNTER — Ambulatory Visit
Admission: EM | Admit: 2022-04-13 | Discharge: 2022-04-13 | Disposition: A | Discharge status: Home / Self Care | Payer: BC Managed Care – PPO | Attending: Family Medicine | Admitting: Family Medicine

## 2022-04-13 ENCOUNTER — Encounter: Payer: Self-pay | Admitting: Emergency Medicine

## 2022-04-13 DIAGNOSIS — N760 Acute vaginitis: Secondary | ICD-10-CM | POA: Insufficient documentation

## 2022-04-13 DIAGNOSIS — G44201 Tension-type headache, unspecified, intractable: Secondary | ICD-10-CM | POA: Insufficient documentation

## 2022-04-13 DIAGNOSIS — E860 Dehydration: Secondary | ICD-10-CM | POA: Diagnosis not present

## 2022-04-13 LAB — POCT URINALYSIS DIP (MANUAL ENTRY)
Bilirubin, UA: NEGATIVE
Glucose, UA: NEGATIVE mg/dL
Ketones, POC UA: NEGATIVE mg/dL
Nitrite, UA: NEGATIVE
Protein Ur, POC: 30 mg/dL — AB
Spec Grav, UA: >=1.03 — AB (ref 1.010–1.025)
Urobilinogen, UA: 1 E.U./dL
pH, UA: 6 (ref 5.0–8.0)

## 2022-04-13 MED ORDER — FLUCONAZOLE 150 MG PO TABS: 150.0000 mg | ORAL_TABLET | ORAL | 0 refills | Status: DC | PRN

## 2022-04-13 MED ORDER — TIZANIDINE HCL 4 MG PO TABS
4.0000 mg | ORAL_TABLET | Freq: Two times a day (BID) | ORAL | 0 refills | Status: DC | PRN
Start: 2022-04-13 — End: 2023-11-28

## 2022-04-13 NOTE — Discharge Instructions (Addendum)
Urine indicates mild dehydration which can worsen headache pain. Increase water intake. I have prescribed Diflucan for your vaginal symptoms. Your lab test can take up to 2 business days to result. If any additional treatment is warranted we will notify you by phone.

## 2022-04-13 NOTE — ED Triage Notes (Signed)
Pt presents with a HA, vaginal discharge and irritation x 2 days.

## 2022-04-13 NOTE — ED Provider Notes (Signed)
[name]    CSN: [id number] Arrival date & time: 04/13/22  8119      History   Chief Complaint Chief Complaint  Patient presents with   Vaginal Irritation   Vaginal Discharge    HPI Tammy Jenkins is a 51 y.o. female.   HPI Patient presents today for evaluation of vaginal irritation, abnormal vaginal discharge and headache x 2 days. Tammy Jenkins reports a copious amount of vaginal discharge which is different than her baseline.  Tammy Jenkins reports also experiencing headache over the last 2 days which feels like a tension type headache.  The location of the headache is in the back of her head.  Tammy Jenkins endorses Tammy Jenkins has not been drinking much fluids.  Patient has a history of hypertension and takes hypertension medication however her blood pressure is stable on arrival here today.  Tammy Jenkins has a distant history of migraines but reports Tammy Jenkins has not had any brain headaches or flares anytime recently.  Tammy Jenkins denies any nausea, vomiting or changes in vision associated with current headache.  Tammy Jenkins has not taken any medication for her current symptoms.   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 Authorizing Provider  ergocalciferol (VITAMIN D2) 1.25 MG (50000 UT) capsule Take 50,000  Units by mouth once a week.   Yes [provider]  fluconazole (DIFLUCAN) 150 MG tablet Take 1 tablet (150 mg total) by mouth every three (3) days as needed (vaginal irritation). Repeat if needed 04/13/22  Yes Scot Jun, FNP  Semaglutide-Weight Management [facility]) 1.7 MG/0.75ML SOAJ Continue with 1.'7mg'$  sub cut once a week until seen in 3 months 12/13/21  Yes [provider]  tiZANidine (ZANAFLEX) 4 MG tablet Take 1 tablet (4 mg total) by mouth every 12 (twelve) hours as needed for muscle spasms. 04/13/22  Yes Scot Jun, FNP  acetaminophen (TYLENOL) 325 MG tablet Take 650 mg by mouth every 6 (six) hours as needed. Pain    [provider]  ibuprofen (ADVIL,MOTRIN) 600 MG tablet Take 1 tablet (600 mg total) every 8 (eight) hours as needed by mouth. 08/10/17   Johnn Hai, PA-C  Multiple Vitamin (MULTIVITAMIN) capsule Take 1 capsule by mouth daily.    [provider]  NIFEdipine (ADALAT CC) 90 MG 24 hr tablet Take 90 mg by mouth daily. 04/05/22   [provider]    Family History Family  History  Problem Relation Age of Onset   Diabetes Maternal Grandmother    Diabetes Father    Hypertension Father    Hypertension Mother    Breast cancer Cousin 33    Social History Social History   Tobacco Use   Smoking status: Never   Smokeless tobacco: Never  Vaping Use   Vaping Use: Never used  Substance Use Topics   Alcohol use: No   Drug use: No     Allergies   Patient has no known allergies.  Review of Systems Review of Systems Pertinent negatives listed in HPI  Physical Exam Triage Vital Signs ED Triage Vitals  Enc Vitals Group     BP 04/13/22 0938 134/84     Pulse Rate 04/13/22 0938 97     Resp 04/13/22 0938 16     Temp 04/13/22 0938 98 F (36.7 C)     Temp Source 04/13/22 0938 Oral     SpO2 04/13/22 0938 97 %     Weight --      Height --      Head Circumference --      Peak Flow --      Pain Score 04/13/22 0936 1      Pain Loc --      Pain Edu? --      Excl. in [place]? --    No data found.  Updated Vital Signs BP 134/84 (BP Location: Left Arm)   Pulse 97   Temp 98 F (36.7 C) (Oral)   Resp 16   LMP 03/04/2022 (Approximate)   SpO2 97%   Visual Acuity Right Eye Distance:   Left Eye Distance:   Bilateral Distance:    Right Eye Near:   Left Eye Near:    Bilateral Near:     Physical Exam Constitutional:      Appearance: Normal appearance.  HENT:     Head: Normocephalic and atraumatic.     Right Ear: Tympanic membrane and ear canal normal.     Left Ear: Tympanic membrane and ear canal normal.     Nose: Nose normal.     Mouth/Throat:     Mouth: Mucous membranes are moist.  Eyes:     Extraocular Movements: Extraocular movements intact.     Pupils: Pupils are equal, round, and reactive to light.  Cardiovascular:     Rate and Rhythm: Normal rate and regular rhythm.  Pulmonary:     Effort: Pulmonary effort is normal.     Breath sounds: Normal breath sounds.  Genitourinary:    Comments: Vaginal cytology self collected Neurological:     Mental Status: Tammy Jenkins is alert.  Psychiatric:        Attention and Perception: Attention and perception normal.        Mood and Affect: Mood and affect normal.        Speech: Speech normal.        Behavior: Behavior normal.      UC Treatments / Results  Labs (all labs ordered are listed, but only abnormal results are displayed) Labs Reviewed  POCT URINALYSIS DIP (MANUAL ENTRY) - Abnormal; Notable for the following components:      Result Value   Clarity, UA cloudy (*)    Spec Grav, UA >=1.030 (*)    Blood, UA trace-intact (*)    Protein Ur, POC =30 (*)    Leukocytes, UA Trace (*)    All other components within normal limits  CERVICOVAGINAL ANCILLARY ONLY  EKG   Radiology No results found.  Procedures Procedures (including critical care time)  Medications Ordered in UC Medications - No data to display  Initial Impression /  Assessment and Plan / UC Course  I have reviewed the triage vital signs and the nursing notes.  Pertinent labs & imaging results that were available during my care of the patient were reviewed by me and considered in my medical decision making (see chart for details).     Treating for acute vaginitis with Diflucan while awaiting vaginal cytology results.  Patient's urine is concentrated indicating mild dehydration encouraged to increase intake of water.  Tammy Jenkins also is having a tension type headache, vital signs and neurological exam are completely unremarkable.  Treatment tizanidine as needed also advised to take 1 dose of either Aleve or ibuprofen with the tizanidine for headache relief.  Tammy Jenkins can return if symptoms worsen or do not improve.  ER if symptoms become severe.  Return as needed or follow-up with primary care provider as needed. Final Clinical Impressions(s) / UC Diagnoses   Final diagnoses:  Vaginitis and vulvovaginitis  Mild dehydration     Discharge Instructions      Urine indicates mild dehydration which can worsen headache pain. Increase water intake. I have prescribed Diflucan for your vaginal symptoms. Your lab test can take up to 2 business days to result. If any additional treatment is warranted we will notify you by phone.     ED Prescriptions     Medication Sig Dispense Auth. Provider   fluconazole (DIFLUCAN) 150 MG tablet Take 1 tablet (150 mg total) by mouth every three (3) days as needed (vaginal irritation). Repeat if needed 2 tablet Scot Jun, FNP   tiZANidine (ZANAFLEX) 4 MG tablet Take 1 tablet (4 mg total) by mouth every 12 (twelve) hours as needed for muscle spasms. 20 tablet Scot Jun, FNP      PDMP not reviewed this encounter.   Scot Jun, FNP 04/13/22 1024

## 2022-04-15 LAB — CERVICOVAGINAL ANCILLARY ONLY
Bacterial Vaginitis (gardnerella): NEGATIVE
Candida Glabrata: NEGATIVE
Candida Vaginitis: NEGATIVE
Comment: NEGATIVE
Comment: NEGATIVE
Comment: NEGATIVE

## 2022-07-09 ENCOUNTER — Other Ambulatory Visit: Payer: Self-pay | Admitting: Internal Medicine

## 2022-07-09 DIAGNOSIS — Z1231 Encounter for screening mammogram for malignant neoplasm of breast: Secondary | ICD-10-CM

## 2022-08-06 ENCOUNTER — Ambulatory Visit
Admission: RE | Admit: 2022-08-06 | Discharge: 2022-08-06 | Disposition: A | Discharge status: Home / Self Care | Payer: BC Managed Care – PPO | Source: Ambulatory Visit | Attending: Internal Medicine | Admitting: Internal Medicine

## 2022-08-06 DIAGNOSIS — Z1231 Encounter for screening mammogram for malignant neoplasm of breast: Secondary | ICD-10-CM | POA: Insufficient documentation

## 2023-05-10 ENCOUNTER — Ambulatory Visit
Admission: RE | Admit: 2023-05-10 | Discharge: 2023-05-10 | Disposition: A | Discharge status: Home / Self Care | Payer: BC Managed Care – PPO | Source: Ambulatory Visit | Attending: Urgent Care | Admitting: Urgent Care

## 2023-05-10 VITALS — BP 124/80 | HR 92 | Temp 97.7°F | Resp 16

## 2023-05-10 DIAGNOSIS — N3000 Acute cystitis without hematuria: Secondary | ICD-10-CM | POA: Diagnosis not present

## 2023-05-10 LAB — POCT URINALYSIS DIP (MANUAL ENTRY)
Glucose, UA: NEGATIVE mg/dL
Ketones, POC UA: NEGATIVE mg/dL
Nitrite, UA: NEGATIVE
Protein Ur, POC: 30 mg/dL — AB
Spec Grav, UA: >=1.03 — AB (ref 1.010–1.025)
Urobilinogen, UA: 0.2 E.U./dL
pH, UA: 5.5 (ref 5.0–8.0)

## 2023-05-10 MED ORDER — NITROFURANTOIN MONOHYD MACRO 100 MG PO CAPS: 100.0000 mg | ORAL_CAPSULE | Freq: Two times a day (BID) | ORAL | 0 refills | Status: DC

## 2023-05-10 NOTE — ED Triage Notes (Signed)
Patient presents to [facility] for possible UTI. Reports dysuria, chills, and abdominal pressure x 3 days. No OTC treatment for symptoms.

## 2023-05-10 NOTE — ED Provider Notes (Signed)
Renaldo Fiddler    CSN: [id number] Arrival date & time: 05/10/23  0931      History   Chief Complaint Chief Complaint  Patient presents with   Urinary Retention    vagina throbing pain and hurts when I pee. - Entered by patient    HPI Tammy Jenkins is a 52 y.o. female.   52yo female presents today with concerns for UTI. States she has never had one, but she's been experiencing bladder pressure, discomfort with urination, frequency and hesitancy for the past 3 days. She also notes chills, but no fever. Denies flank pain, hematuria, pelvic pain, vaginal discharge, rash or lesions. She has not tried anything OTC. Did have an episode of dizziness. Denies lethargy or weakness. No hx of kidney stones.      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 Authorizing Provider  nitrofurantoin, macrocrystal-monohydrate, (MACROBID) 100 MG capsule Take 1 capsule (100 mg total) by mouth 2 (two) times daily. 05/10/23  Yes Jaskiran Pata L, PA  acetaminophen (TYLENOL) 325 MG tablet Take 650 mg by mouth every 6 (six) hours as needed. Pain    [provider]  ergocalciferol (VITAMIN D2) 1.25 MG (50000 UT) capsule Take 50,000 Units by mouth once a week.    [provider]  fluconazole (DIFLUCAN) 150 MG tablet Take 1 tablet (150 mg total) by mouth every three (3) days as needed (vaginal irritation). Repeat if needed 04/13/22   Bing Neighbors, NP  ibuprofen (ADVIL,MOTRIN) 600 MG tablet Take 1 tablet (600 mg total) every 8 (eight) hours as needed by mouth. 08/10/17   Tommi Rumps, PA-C  Multiple Vitamin (MULTIVITAMIN) capsule Take 1 capsule by mouth daily.    [provider]  NIFEdipine (ADALAT CC) 90 MG 24 hr tablet Take 90 mg by mouth daily. 04/05/22   [provider]  Semaglutide-Weight Management (WEG[facility]) 1.7 MG/0.75ML SOAJ Continue with 1.7mg  sub cut once a week until seen in 3 months 12/13/21   [provider]  tiZANidine (ZANAFLEX) 4 MG tablet Take 1 tablet (4 mg total) by mouth every 12 (twelve) hours as needed for muscle spasms. 04/13/22   Bing Neighbors, NP    Family History Family History  Problem Relation Age of Onset   Diabetes Maternal Grandmother    Diabetes Father    Hypertension Father    Hypertension Mother  Breast cancer Cousin 30    Social History Social History   Tobacco Use   Smoking status: Never   Smokeless tobacco: Never  Vaping Use   Vaping status: Never Used  Substance Use Topics   Alcohol use: No   Drug use: No     Allergies   Patient has no known allergies.   Review of Systems Review of Systems As per HPI  Physical Exam Triage Vital Signs ED Triage Vitals  Encounter Vitals Group     BP 05/10/23 0945 124/80     Systolic BP Percentile --      Diastolic BP Percentile --      Pulse Rate 05/10/23 0945 92     Resp 05/10/23 0945 16     Temp 05/10/23 0945 97.7 F (36.5 C)     Temp Source 05/10/23 0945 Temporal     SpO2 05/10/23 0945 96 %     Weight --      Height --      Head Circumference --      Peak Flow --      Pain Score 05/10/23 0946 0     Pain Loc --      Pain Education --      Exclude from Growth Chart --    No data found.  Updated Vital  Signs BP 124/80 (BP Location: Left Arm)   Pulse 92   Temp 97.7 F (36.5 C) (Temporal)   Resp 16   SpO2 96%   Visual Acuity Right Eye Distance:   Left Eye Distance:   Bilateral Distance:    Right Eye Near:   Left Eye Near:    Bilateral Near:     Physical Exam Vitals and nursing note reviewed.  Constitutional:      General: She is not in acute distress.    Appearance: Normal appearance. She is not ill-appearing, toxic-appearing or diaphoretic.  HENT:     Head: Normocephalic and atraumatic.  Eyes:     Pupils: Pupils are equal, round, and reactive to light.  Cardiovascular:     Rate and Rhythm: Normal rate.     Pulses: Normal pulses.     Heart sounds: Normal heart sounds. No murmur heard.    No friction rub. No gallop.  Pulmonary:     Effort: Pulmonary effort is normal. No respiratory distress.     Breath sounds: Normal breath sounds. No wheezing or rales.  Chest:     Chest wall: No tenderness.  Abdominal:     General: Abdomen is flat. Bowel sounds are normal. There is no distension.     Palpations: Abdomen is soft. There is no mass.     Tenderness: There is no abdominal tenderness. There is no right CVA tenderness, left CVA tenderness, guarding or rebound.     Hernia: No hernia is present.  Musculoskeletal:     Right lower leg: No edema.     Left lower leg: No edema.  Skin:    General: Skin is warm.     Findings: No bruising, erythema or rash.  Neurological:     General: No focal deficit present.     Mental Status: She is alert and oriented to person, place, and time. Mental status is at baseline.     Cranial Nerves: No cranial nerve deficit.     Sensory: No sensory deficit.     Motor: No weakness.  Psychiatric:        Mood and Affect: Mood normal.  UC Treatments / Results  Labs (all labs ordered are listed, but only abnormal results are displayed) Labs Reviewed  POCT URINALYSIS DIP (MANUAL ENTRY) - Abnormal; Notable for the following components:       Result Value   Clarity, UA cloudy (*)    Bilirubin, UA small (*)    Spec Grav, UA >=1.030 (*)    Blood, UA moderate (*)    Protein Ur, POC =30 (*)    Leukocytes, UA Trace (*)    All other components within normal limits  URINE CULTURE    EKG   Radiology No results found.  Procedures Procedures (including critical care time)  Medications Ordered in UC Medications - No data to display  Initial Impression / Assessment and Plan / UC Course  I have reviewed the triage vital signs and the nursing notes.  Pertinent labs & imaging results that were available during my care of the patient were reviewed by me and considered in my medical decision making (see chart for details).     Acute cystitis - pt exhibiting signs of UTI. Dipstick in office suggestive of such, will send out culture for confirmation. VSS, no concern for systemic infection or pyelonephritis. Offered Pyridium, pt declined. Will do macrobid BID x 5 days. RTC precautions discussed   Final Clinical Impressions(s) / UC Diagnoses   Final diagnoses:  Acute cystitis without hematuria     Discharge Instructions      Your symptoms are consistent with a urinary tract infection. Start taking the antibiotic twice daily until completed. We will send out your urine culture and only call if a change in treatment is necessary. Monitor for any change in or worsening symptoms; fever, hematuria (blood in urine) or flank pain would warrant a recheck.      ED Prescriptions     Medication Sig Dispense Auth. Provider   nitrofurantoin, macrocrystal-monohydrate, (MACROBID) 100 MG capsule Take 1 capsule (100 mg total) by mouth 2 (two) times daily. 10 capsule Toluwani Ruder L, PA      PDMP not reviewed this encounter.   Maretta Bees, [place] 05/11/23 [phone]

## 2023-05-10 NOTE — Discharge Instructions (Addendum)
Your symptoms are consistent with a urinary tract infection. Start taking the antibiotic twice daily until completed. We will send out your urine culture and only call if a change in treatment is necessary. Monitor for any change in or worsening symptoms; fever, hematuria (blood in urine) or flank pain would warrant a recheck.

## 2023-05-11 LAB — URINE CULTURE

## 2023-06-13 ENCOUNTER — Other Ambulatory Visit: Payer: Self-pay | Admitting: Internal Medicine

## 2023-06-13 DIAGNOSIS — Z1231 Encounter for screening mammogram for malignant neoplasm of breast: Secondary | ICD-10-CM

## 2023-08-08 ENCOUNTER — Ambulatory Visit
Admission: RE | Admit: 2023-08-08 | Discharge: 2023-08-08 | Disposition: A | Discharge status: Home / Self Care | Payer: BC Managed Care – PPO | Source: Ambulatory Visit | Attending: Internal Medicine | Admitting: Internal Medicine

## 2023-08-08 ENCOUNTER — Encounter: Payer: Self-pay | Admitting: Radiology

## 2023-08-08 DIAGNOSIS — Z1231 Encounter for screening mammogram for malignant neoplasm of breast: Secondary | ICD-10-CM | POA: Insufficient documentation

## 2023-11-27 ENCOUNTER — Emergency Department: Admission: EM | Admit: 2023-11-27 | Discharge: 2023-11-27 | Discharge status: Against Medical Advice | Payer: 59

## 2023-11-28 ENCOUNTER — Ambulatory Visit: Payer: Self-pay

## 2023-11-28 ENCOUNTER — Ambulatory Visit (HOSPITAL_COMMUNITY)
Admission: RE | Admit: 2023-11-28 | Discharge: 2023-11-28 | Disposition: A | Discharge status: Home / Self Care | Payer: 59 | Source: Ambulatory Visit | Attending: Emergency Medicine | Admitting: Emergency Medicine

## 2023-11-28 ENCOUNTER — Encounter (HOSPITAL_COMMUNITY): Payer: Self-pay

## 2023-11-28 ENCOUNTER — Ambulatory Visit (HOSPITAL_COMMUNITY): Payer: 59

## 2023-11-28 VITALS — BP 143/97 | HR 84 | Temp 98.2°F | Resp 16

## 2023-11-28 DIAGNOSIS — M1711 Unilateral primary osteoarthritis, right knee: Secondary | ICD-10-CM

## 2023-11-28 DIAGNOSIS — M25561 Pain in right knee: Secondary | ICD-10-CM | POA: Diagnosis not present

## 2023-11-28 MED ORDER — ACETAMINOPHEN 500 MG PO TABS
1000.0000 mg | ORAL_TABLET | Freq: Three times a day (TID) | ORAL | 0 refills | Status: AC
Start: 2023-11-28 — End: 2023-12-28

## 2023-11-28 MED ORDER — DICLOFENAC SODIUM 50 MG PO TBEC: 50.0000 mg | DELAYED_RELEASE_TABLET | Freq: Two times a day (BID) | ORAL | 2 refills | Status: AC

## 2023-11-28 NOTE — ED Triage Notes (Signed)
Patient presenting with right knee pain onset last Thursday. No known falls or injuries.  Prescriptions or OTC medications tried: Yes- Aleve    with moderate relief

## 2023-11-28 NOTE — Discharge Instructions (Addendum)
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  Arthritis literally means joint inflammation.   The mainstay of therapy for musculoskeletal pain is reduction of inflammation.  Keep in mind, pain always begets more pain.  To help you stay ahead of your pain and inflammation, I have provided the following regimen for you:   When you pick up your prescription from the pharmacy, please begin taking Tylenol 975 mg 3 times daily (every 8 hours).  Please know that It is safe to take a maximum 3000 mg of Tylenol in a 24-hour period.  Please do not exceed this amount.  Tylenol works best when taken on a scheduled basis.   When you pick up your prescription from the pharmacy, please begin taking diclofenac (Voltaren) 50 mg twice daily.  Please keep in mind that it is always easier to treat a little bit of pain that is to treat a lot of pain.  I recommend that for the next several days, you take this medication on a scheduled basis.  After that, take it when you begin to feel the pain returning, do not wait until you are in a lot of pain.   During the day, please set aside time to apply ice to the affected area 4 times daily for 20 minutes each application.  This can be achieved by using a bag of frozen peas or corn, a Ziploc bag filled with ice and water, or Ziploc bag filled with half rubbing alcohol and half Dawn dish detergent, frozen into a slush.  Please be careful not to apply ice directly to your skin, always place a soft cloth between you and the ice pack.  Over-the-counter products such as IcyHot and Biofreeze do not work nearly as well.   Please consider discussing referral to physical therapy with your primary care provider.  Physical therapist are very good at teasing out the underlying cause of acute musculoskeletal pain and helping with prevention of future recurrences.   I also recommend that you remain out of work for  the next several days, I provided you with a note to return to work in 3 days.  If you feel that you need this time extended, please follow-up with your primary care provider or return to urgent care for reevaluation so that we can provide you with a note for another 3 days.   Thank you for visiting [facility] today.  We appreciate the opportunity to participate in your care.

## 2023-11-28 NOTE — ED Provider Notes (Signed)
[facility]    CSN: [id number] Arrival date & time: 11/28/23  1623    HISTORY   Chief Complaint  Patient presents with   Knee Pain   Appointment   HPI Tammy Jenkins is a pleasant, 53 y.o. female who presents to urgent care today. Patient complains of an 8-day history of pain in her right knee.  Patient states that she had been doing a lot of shopping prior to this.  Patient denies recent fall or known recent or prior injury to her right knee.  Patient states she is never had a similar pain in the past.  Patient states has been taking Aleve with moderate relief for about 8 hours.  Patient states she is also been using Biofreeze, over-the-counter lidocaine patches and an electric heating pad, all of which provide temporary relief.  The history is provided by the patient.   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 Authorizing Provider  Multiple Vitamin (MULTIVITAMIN) capsule Take 1 capsule by mouth daily.   Yes [provider]  NIFEdipine (ADALAT CC) 90 MG 24 hr tablet Take 90 mg by mouth daily. 04/05/22  Yes [provider]    Family History Family History  Problem Relation Age of Onset   Diabetes Maternal Grandmother    Diabetes Father     Hypertension Father    Hypertension Mother    Breast cancer Cousin 63   Social History Social History   Tobacco Use   Smoking status: Never   Smokeless tobacco: Never  Vaping Use   Vaping status: Never Used  Substance Use Topics   Alcohol use: No   Drug use: No   Allergies   Patient has no known allergies.  Review of Systems Review of Systems Pertinent findings revealed after performing a 14 point review of systems has been noted in the history of present illness.  Physical Exam Vital Signs BP (!) 143/97 (BP Location: Right Arm)   Pulse 84   Temp 98.2 F (36.8 C) (Oral)   Resp 16   LMP 05/08/2023 (Approximate)   SpO2 98%   No data found.  Physical Exam Vitals and nursing note reviewed.  Constitutional:      General: She is not in acute distress.    Appearance: Normal appearance.  HENT:     Head: Normocephalic and atraumatic.  Eyes:     Pupils: Pupils are equal, round, and reactive  to light.  Cardiovascular:     Rate and Rhythm: Normal rate and regular rhythm.  Pulmonary:     Effort: Pulmonary effort is normal.     Breath sounds: Normal breath sounds.  Musculoskeletal:        General: Normal range of motion.     Cervical back: Normal range of motion and neck supple.     Right knee: Bony tenderness present. No swelling, deformity or crepitus. Normal range of motion. Tenderness present over the medial joint line. No lateral joint line, MCL, LCL, ACL, PCL or patellar tendon tenderness. No LCL laxity, MCL laxity, ACL laxity or PCL laxity. Normal alignment, normal meniscus and normal patellar mobility.     Instability Tests: Anterior drawer test negative. Medial McMurray test positive. Lateral McMurray test negative.  Skin:    General: Skin is warm and dry.  Neurological:     General: No focal deficit present.     Mental Status: She is alert and oriented to person, place, and time. Mental status is at baseline.  Psychiatric:        Mood and Affect: Mood normal.         Behavior: Behavior normal.        Thought Content: Thought content normal.        Judgment: Judgment normal.     UC Couse / Diagnostics / Procedures:     Radiology DG Knee Complete 4 Views Right Result Date: 11/28/2023 CLINICAL DATA:  Right knee pain. EXAM: RIGHT KNEE - COMPLETE 4+ VIEW COMPARISON:  None Available. FINDINGS: No evidence of fracture, dislocation, or joint effusion. No evidence of arthropathy or other focal bone abnormality. Soft tissues are unremarkable. IMPRESSION: Negative. Electronically Signed   By: Darliss Cheney M.D.   On: 11/28/2023 17:39    Procedures Procedures (including critical care time) EKG  Pending results:  Labs Reviewed - No data to display  Medications Ordered in UC: Medications - No data to display  UC Diagnoses / Final Clinical Impressions(s)   I have reviewed the triage vital signs and the nursing notes.  Pertinent labs & imaging results that were available during my care of the patient were reviewed by me and considered in my medical decision making (see chart for details).    Final diagnoses:  Acute pain of right knee  Primary osteoarthritis of right knee   Patient advised of x-ray report and that per my personal read, believe that she is showing early signs of osteoarthritis in the medial aspect of her right knee. Patient was advised to: Take diclofenac 50 mg twice daily for the next 5 to 7 days Begin acetaminophen 1000 mg 3 times daily on a scheduled basis. Apply ice pack to affected area 4 times daily for 20 minutes each time Consider physical therapy, chiropractic care, orthopedic follow-up Return precautions advised  Please see discharge instructions below for details of plan of care as provided to patient. ED Prescriptions     Medication Sig Dispense Auth. Provider   diclofenac (VOLTAREN) 50 MG EC tablet Take 1 tablet (50 mg total) by mouth 2 (two) times daily. 60 tablet Theadora Rama Scales, PA-C   acetaminophen  (TYLENOL) 500 MG tablet Take 2 tablets (1,000 mg total) by mouth every 8 (eight) hours for 90 doses. 180 tablet Theadora Rama Scales, PA-C      PDMP not reviewed this encounter.    Discharge Instructions      The results of the x-ray that we performed of your right knee  during your visit today showed mild degenerative osteoarthritis.  Arthritis literally means joint inflammation.   The mainstay of therapy for musculoskeletal pain is reduction of inflammation.  Keep in mind, pain always begets more pain.  To help you stay ahead of your pain and inflammation, I have provided the following regimen for you:   When you pick up your prescription from the pharmacy, please begin taking Tylenol 975 mg 3 times daily (every 8 hours).  Please know that It is safe to take a maximum 3000 mg of Tylenol in a 24-hour period.  Please do not exceed this amount.  Tylenol works best when taken on a scheduled basis.   When you pick up your prescription from the pharmacy, please begin taking diclofenac (Voltaren) 50 mg twice daily.  Please keep in mind that it is always easier to treat a little bit of pain that is to treat a lot of pain.  I recommend that for the next several days, you take this medication on a scheduled basis.  After that, take it when you begin to feel the pain returning, do not wait until you are in a lot of pain.   During the day, please set aside time to apply ice to the affected area 4 times daily for 20 minutes each application.  This can be achieved by using a bag of frozen peas or corn, a Ziploc bag filled with ice and water, or Ziploc bag filled with half rubbing alcohol and half Dawn dish detergent, frozen into a slush.  Please be careful not to apply ice directly to your skin, always place a soft cloth between you and the ice pack.  Over-the-counter products such as IcyHot and Biofreeze do not work nearly as well.   Please consider discussing referral to physical therapy with your  primary care provider.  Physical therapist are very good at teasing out the underlying cause of acute musculoskeletal pain and helping with prevention of future recurrences.   I also recommend that you remain out of work for the next several days, I provided you with a note to return to work in 3 days.  If you feel that you need this time extended, please follow-up with your primary care provider or return to urgent care for reevaluation so that we can provide you with a note for another 3 days.   Thank you for visiting [facility] today.  We appreciate the opportunity to participate in your care.       Disposition Upon Discharge:  Condition: stable for discharge home Home: take medications as prescribed; routine discharge instructions as discussed; follow up as advised.  Patient presented with an acute illness with associated systemic symptoms and significant discomfort requiring urgent management. In my opinion, this is a condition that a prudent lay person (someone who possesses an average knowledge of health and medicine) may potentially expect to result in complications if not addressed urgently such as respiratory distress, impairment of bodily function or dysfunction of bodily organs.   Routine symptom specific, illness specific and/or disease specific instructions were discussed with the patient and/or caregiver at length.   As such, the patient has been evaluated and assessed, work-up was performed and treatment was provided in alignment with urgent care protocols and evidence based medicine.  Patient/parent/caregiver has been advised that the patient may require follow up for further testing and treatment if the symptoms continue in spite of treatment, as clinically indicated and appropriate.  Patient/parent/caregiver has been advised  to report to orthopedic urgent care clinic or return to the [facility] or PCP in 3-5 days if no better; follow-up with orthopedics, PCP or the  Emergency Department if new signs and symptoms develop or if the current signs or symptoms continue to change or worsen for further workup, evaluation and treatment as clinically indicated and appropriate  The patient will follow up with their current PCP if and as advised. If the patient does not currently have a PCP we will have assisted them in obtaining one.   The patient may need specialty follow up if the symptoms continue, in spite of conservative treatment and management, for further workup, evaluation, consultation and treatment as clinically indicated and appropriate.  Patient/parent/caregiver verbalized understanding and agreement of plan as discussed.  All questions were addressed during visit.  Please see discharge instructions below for further details of plan.  This office note has been dictated using Teaching laboratory technician.  Unfortunately, this method of dictation can sometimes lead to typographical or grammatical errors.  I apologize for your inconvenience in advance if this occurs.  Please do not hesitate to reach out to me if clarification is needed.      Theadora Rama Scales, [place] 11/28/23 [phone]

## 2024-03-09 DIAGNOSIS — R7989 Other specified abnormal findings of blood chemistry: Secondary | ICD-10-CM | POA: Insufficient documentation

## 2024-07-30 ENCOUNTER — Encounter: Payer: Self-pay | Admitting: Internal Medicine

## 2024-07-30 ENCOUNTER — Other Ambulatory Visit: Payer: Self-pay | Admitting: Internal Medicine

## 2024-07-30 DIAGNOSIS — Z1231 Encounter for screening mammogram for malignant neoplasm of breast: Secondary | ICD-10-CM

## 2024-08-31 ENCOUNTER — Encounter

## 2024-09-09 ENCOUNTER — Encounter: Payer: Self-pay | Admitting: Dermatology

## 2024-09-09 ENCOUNTER — Ambulatory Visit: Admitting: Dermatology

## 2024-09-09 DIAGNOSIS — L810 Postinflammatory hyperpigmentation: Secondary | ICD-10-CM

## 2024-09-09 DIAGNOSIS — K130 Diseases of lips: Secondary | ICD-10-CM | POA: Diagnosis not present

## 2024-09-09 DIAGNOSIS — L853 Xerosis cutis: Secondary | ICD-10-CM | POA: Diagnosis not present

## 2024-09-09 DIAGNOSIS — L209 Atopic dermatitis, unspecified: Secondary | ICD-10-CM | POA: Diagnosis not present

## 2024-09-09 DIAGNOSIS — Z79899 Other long term (current) drug therapy: Secondary | ICD-10-CM

## 2024-09-09 MED ORDER — KETOCONAZOLE 2 % EX CREA: 1.0000 | TOPICAL_CREAM | Freq: Two times a day (BID) | CUTANEOUS | 2 refills | Status: AC

## 2024-09-09 MED ORDER — HYDROCORTISONE 2.5 % EX CREA: TOPICAL_CREAM | Freq: Two times a day (BID) | CUTANEOUS | 1 refills | Status: DC | PRN

## 2024-09-09 NOTE — Patient Instructions (Addendum)
 Continue Dove soap Start Cerave cream or Cetaphil cream daily to face  For darkness underside chin Inky 2% Naturium 5%          Due to recent changes in healthcare laws, you may see results of your pathology and/or laboratory studies on MyChart before the doctors have had a chance to review them. We understand that in some cases there may be results that are confusing or concerning to you. Please understand that not all results are received at the same time and often the doctors may need to interpret multiple results in order to provide you with the best plan of care or course of treatment. Therefore, we ask that you please give us  2 business days to thoroughly review all your results before contacting the office for clarification. Should we see a critical lab result, you will be contacted sooner.   If You Need Anything After Your Visit  If you have any questions or concerns for your doctor, please call our main line at [phone] and press option 4 to reach your doctor's medical assistant. If no one answers, please leave a voicemail as directed and we will return your call as soon as possible. Messages left after 4 pm will be answered the following business day.   You may also send us  a message via MyChart. We typically respond to MyChart messages within 1-2 business days.  For prescription refills, please ask your pharmacy to contact our office. Our fax number is [phone].  If you have an urgent issue when the clinic is closed that cannot wait until the next business day, you can page your doctor at the number below.    Please note that while we do our best to be available for urgent issues outside of office hours, we are not available 24/7.   If you have an urgent issue and are unable to reach us , you may choose to seek medical care at your doctor's office, retail clinic, urgent care center, or emergency room.  If you have a medical emergency, please immediately call 911 or  go to the emergency department.  Pager Numbers  - Dr. Hester: [phone]  - Dr. Jackquline: [phone]  - Dr. Claudene: [phone]   - Dr. Raymund: [phone]  In the event of inclement weather, please call our main line at [phone] for an update on the status of any delays or closures.  Dermatology Medication Tips: Please keep the boxes that topical medications come in in order to help keep track of the instructions about where and how to use these. Pharmacies typically print the medication instructions only on the boxes and not directly on the medication tubes.   If your medication is too expensive, please contact our office at [phone] option 4 or send us  a message through MyChart.   We are unable to tell what your co-pay for medications will be in advance as this is different depending on your insurance coverage. However, we may be able to find a substitute medication at lower cost or fill out paperwork to get insurance to cover a needed medication.   If a prior authorization is required to get your medication covered by your insurance company, please allow us  1-2 business days to complete this process.  Drug prices often vary depending on where the prescription is filled and some pharmacies may offer cheaper prices.  The website www.goodrx.com contains coupons for medications through different pharmacies. The prices here do not account for what the cost may be with help  from insurance (it may be cheaper with your insurance), but the website can give you the price if you did not use any insurance.  - You can print the associated coupon and take it with your prescription to the pharmacy.  - You may also stop by our office during regular business hours and pick up a GoodRx coupon card.  - If you need your prescription sent electronically to a different pharmacy, notify our office through [facility] or by phone at [phone] option 4.     Si Usted Necesita Algo  Despus de Su Visita  Tambin puede enviarnos un mensaje a travs de Clinical Cytogeneticist. Por lo general respondemos a los mensajes de MyChart en el transcurso de 1 a 2 das hbiles.  Para renovar recetas, por favor pida a su farmacia que se ponga en contacto con nuestra oficina. Randi lakes de fax es [place] [phone].  Si tiene un asunto urgente cuando la clnica est cerrada y que no puede esperar hasta el siguiente da hbil, puede llamar/localizar a su doctor(a) al nmero que aparece a continuacin.   Por favor, tenga en cuenta que aunque hacemos todo lo posible para estar disponibles para asuntos urgentes fuera del horario de [place], no estamos disponibles las 24 horas del da, los 7 [address]  [address] de la [place].   Si tiene un problema urgente y no puede comunicarse con nosotros, puede optar por buscar atencin mdica  en el consultorio de su doctor(a), en una clnica privada, en un centro de atencin urgente o en una sala de emergencias.  Si tiene engineer, drilling, por favor llame inmediatamente al 911 o vaya a la sala de emergencias.  Nmeros de bper  - Dr. Hester: [phone]  - Dra. Jackquline: [phone]  - Dr. Claudene: [phone]  - Dra. Kitts: [phone]  En caso de inclemencias del [place], por favor llame a nuestra lnea principal al [phone] para una actualizacin sobre el estado de cualquier retraso o cierre.  Consejos para la medicacin en dermatologa: Por favor, guarde las cajas en las que vienen los medicamentos de uso tpico para ayudarle a seguir las instrucciones sobre dnde y cmo usarlos. Las farmacias generalmente imprimen las instrucciones del medicamento slo en las cajas y no directamente en los tubos del [place].   Si su medicamento es muy caro, por favor, pngase en contacto con landry rieger llamando al [phone] y presione la opcin 4 o envenos un mensaje a travs de Clinical Cytogeneticist.   No podemos decirle cul ser su copago por los medicamentos por  adelantado ya que esto es diferente dependiendo de la cobertura de su seguro. Sin embargo, es posible que podamos encontrar un medicamento sustituto a audiological scientist un formulario para que el seguro cubra el medicamento que se considera necesario.   Si se requiere una autorizacin previa para que su compaa de seguros cubra su medicamento, por favor permtanos de 1 a 2 das hbiles para completar este proceso.  Los precios de los medicamentos varan con frecuencia dependiendo del environmental consultant de dnde se surte la receta y alguna farmacias pueden ofrecer precios ms baratos.  El sitio web www.goodrx.com tiene cupones para medicamentos de health and safety inspector. Los precios aqu no tienen en cuenta lo que podra costar con la ayuda del seguro (puede ser ms barato con su seguro), pero el sitio web puede darle el precio si no utiliz tourist information centre manager.  - Puede imprimir el cupn correspondiente y llevarlo con su receta a la farmacia.  - Tambin puede pasar por  nuestra oficina durante el horario de atencin regular y recoger una tarjeta de cupones de GoodRx.  - Si necesita que su receta se enve electrnicamente a una farmacia diferente, informe a nuestra oficina a travs de MyChart de [place] o por telfono llamando al [phone] y presione la opcin 4.

## 2024-09-09 NOTE — Progress Notes (Signed)
   New Patient Visit   Subjective  Tammy Jenkins is a 53 y.o. female who presents for the following: pigmentation underside chin, yrs, hx of having bumps underside chin but has been getting laser treatment  hx of , scaly dry area perioral, ~9m, Vaseline, Coco Butter, no hjx of eczema, no hx of bx to this area skin discoloration upper forehead, 59m,     The following portions of the chart were reviewed this encounter and updated as appropriate: medications, allergies, medical history  Review of Systems:  No other skin or systemic complaints except as noted in HPI or Assessment and Plan.  Objective  Well appearing patient in no apparent distress; mood and affect are within normal limits.   A focused examination was performed of the following areas: face  Relevant exam findings are noted in the Assessment and Plan.  Corners of mouth       Underside chin     Assessment & Plan   ANGULAR CHEILITIS perioral Exam hyperpigmented slightly lichenified scaly patches extending laterally and inferiorly from oral commissures  Treatment Plan Discussed pathogenesis with candida and irritation Start Ketoconazole 2% cream bid aa corners of mouth until smooth then prn flares Start HC 2.5% cr bid to corners of mouth until smooth then prn flares  Topical steroids (such as triamcinolone , fluocinolone, fluocinonide, mometasone, clobetasol, halobetasol, betamethasone, hydrocortisone) can cause thinning and lightening of the skin if they are used for too long in the same area. Your physician has selected the right strength medicine for your problem and area affected on the body. Please use your medication only as directed by your physician to prevent side effects.   Long term medication management.  Patient is using long term (months to years) prescription medication  to control their dermatologic condition.  These medications require periodic monitoring to evaluate for efficacy and side effects  and may require periodic laboratory monitoring.  POST-INFLAMMATORY HYPERPIGMENTATION (PIH) Exam: hyperpigmented macules from folliculitis on submentum   Post-inflammatory hyperpimentation (PIH)  is a benign condition that comes from having previous inflammation in the skin and will fade with time over months to sometimes years. Recommend daily sun protection including sunscreen SPF 30+ to sun-exposed areas. - Recommend treating any itchy or red areas on the skin quickly to prevent new areas of PIH. Once rash has cleared, treating with prescription medicines such as hydroquinone may help fade dark spots faster.    Treatment Plan: Recommend otc Inkey 2% Tranexamic Acid or Naturium 5% Tranexamic Acid daily Patient gets laser hair reduction in [place]  Mild atopic dermatitis with xerosis and hyperpigmentation Exam: hyperpigmented subtly scaly patches on forehead temples cheeks  Plan: Thick moisturizer daily. Cerave cetaphil vanicream ANGULAR CHEILITIS   XEROSIS CUTIS   ATOPIC DERMATITIS, UNSPECIFIED TYPE   POSTINFLAMMATORY HYPERPIGMENTATION    Return in about 2 months (around 11/10/2024) for f/u Angular Cheilits and PIPA.  I, Grayce Saunas, RMA, am acting as scribe for [name], MD .   Documentation: I have reviewed the above documentation for accuracy and completeness, and I agree with the above.  [name], MD

## 2024-10-09 ENCOUNTER — Encounter: Payer: Self-pay | Admitting: Dermatology

## 2024-10-12 ENCOUNTER — Other Ambulatory Visit: Payer: Self-pay | Admitting: Dermatology

## 2024-10-12 DIAGNOSIS — L810 Postinflammatory hyperpigmentation: Secondary | ICD-10-CM

## 2024-10-12 DIAGNOSIS — K130 Diseases of lips: Secondary | ICD-10-CM

## 2024-10-12 MED ORDER — PIMECROLIMUS 1 % EX CREA: TOPICAL_CREAM | Freq: Two times a day (BID) | CUTANEOUS | 0 refills | Status: DC

## 2024-10-17 ENCOUNTER — Other Ambulatory Visit: Payer: Self-pay | Admitting: Dermatology

## 2024-10-17 DIAGNOSIS — K130 Diseases of lips: Secondary | ICD-10-CM

## 2024-10-17 MED ORDER — TACROLIMUS 0.1 % EX OINT: TOPICAL_OINTMENT | Freq: Two times a day (BID) | CUTANEOUS | 0 refills | Status: AC

## 2024-10-18 ENCOUNTER — Encounter

## 2024-11-09 ENCOUNTER — Encounter

## 2024-11-11 ENCOUNTER — Ambulatory Visit: Admitting: Dermatology

## 2024-11-11 ENCOUNTER — Encounter: Payer: Self-pay | Admitting: Dermatology

## 2024-11-11 DIAGNOSIS — L810 Postinflammatory hyperpigmentation: Secondary | ICD-10-CM

## 2024-11-11 DIAGNOSIS — K130 Diseases of lips: Secondary | ICD-10-CM | POA: Diagnosis not present

## 2024-11-11 MED ORDER — HYDROQUINONE 4 % EX CREA: TOPICAL_CREAM | Freq: Two times a day (BID) | CUTANEOUS | 1 refills | Status: AC

## 2024-11-11 NOTE — Patient Instructions (Signed)

## 2024-11-11 NOTE — Progress Notes (Signed)
" ° °  Follow-Up Visit   Subjective  Tammy Jenkins is a 54 y.o. female who presents for the following: Angular Cheilitis 50m f/u, perioral d/c Ketoconazole  2% cr and HC 2.5% cr due to improving, PIH submentum, Naturium 5% Tranexamic Acid qk, not much improvement   The following portions of the chart were reviewed this encounter and updated as appropriate: medications, allergies, medical history  Review of Systems:  No other skin or systemic complaints except as noted in HPI or Assessment and Plan.  Objective  Well appearing patient in no apparent distress; mood and affect are within normal limits.   A focused examination was performed of the following areas: face  Relevant exam findings are noted in the Assessment and Plan.    Assessment & Plan   ANGULAR CHEILITIS, improved with treatment Oral commissures Exam: resolution of scale and lichenification. Residual PIH.   Chronic condition with duration or expected duration over one year. Currently well-controlled.   Angular Cheilitis is chronic skin irritation in folds of corners of mouth.  A moist environment from saliva promotes overgrowth of yeast and bacteria, resulting in chronic inflammation in this area.  Treatment Plan: Cont Ketoconazole  2% cr bid prn flares Cont HC 2.5% cr bid to corners of mouth prn flares  Topical steroids (such as triamcinolone , fluocinolone, fluocinonide, mometasone, clobetasol, halobetasol, betamethasone, hydrocortisone ) can cause thinning and lightening of the skin if they are used for too long in the same area. Your physician has selected the right strength medicine for your problem and area affected on the body. Please use your medication only as directed by your physician to prevent side effects.    POST-INFLAMMATORY HYPERPIGMENTATION (PIH) submentum Exam: hyperpigmented macules at submentum, slightly improved but still present   Post-inflammatory hyperpimentation (PIH)  is a benign condition that  comes from having previous inflammation in the skin and will fade with time over months to sometimes years. Recommend daily sun protection including sunscreen SPF 30+ to sun-exposed areas. - Recommend treating any itchy or red areas on the skin quickly to prevent new areas of PIH. Once rash has cleared, treating with prescription medicines such as hydroquinone  may help fade dark spots faster.    Treatment Plan:  Start Hydroquinine 4% cr bid for up to 3 months to underside chin then d/c May cont Naturium 5% Tranexamic Acid daily   Reviewed risk of permanent dark spots (ochronosis) if hydroquinone  is used longer than 3 months.  ANGULAR CHEILITIS   Existing Treatments - tacrolimus  (PROTOPIC ) 0.1 % ointment - Apply topically 2 (two) times daily. May sting initially but this should improve with continued use POSTINFLAMMATORY HYPERPIGMENTATION    Return in about 3 months (around 02/08/2025) for PIH and Angular Cheilitis f/u.  I, Grayce Saunas, RMA, am acting as scribe for [name], MD .   Documentation: I have reviewed the above documentation for accuracy and completeness, and I agree with the above.  [name], MD    "

## 2024-11-16 ENCOUNTER — Encounter

## 2025-02-08 ENCOUNTER — Ambulatory Visit: Admitting: Dermatology
